# Patient Record
Sex: Female | Born: 1950 | Race: Black or African American | Hispanic: No | Marital: Married | Smoking: Never smoker
Health system: Southern US, Community
[De-identification: ages and names within clinical notes are randomized; demographics above are authoritative.]

## PROBLEM LIST (undated history)

## (undated) DIAGNOSIS — I10 Essential (primary) hypertension: Secondary | ICD-10-CM

## (undated) HISTORY — PX: APPENDECTOMY: SHX54

---

## 2009-09-01 ENCOUNTER — Encounter: Admission: RE | Admit: 2009-09-01 | Discharge: 2009-09-01 | Payer: Self-pay | Admitting: Otolaryngology

## 2012-02-29 ENCOUNTER — Inpatient Hospital Stay (HOSPITAL_COMMUNITY)
Admission: EM | Admit: 2012-02-29 | Discharge: 2012-03-03 | DRG: 294 | Disposition: A | Discharge status: Home / Self Care | Payer: BC Managed Care – PPO | Attending: Internal Medicine | Admitting: Internal Medicine

## 2012-02-29 ENCOUNTER — Encounter (HOSPITAL_COMMUNITY): Payer: Self-pay | Admitting: Emergency Medicine

## 2012-02-29 DIAGNOSIS — I4729 Other ventricular tachycardia: Secondary | ICD-10-CM | POA: Diagnosis not present

## 2012-02-29 DIAGNOSIS — R739 Hyperglycemia, unspecified: Secondary | ICD-10-CM

## 2012-02-29 DIAGNOSIS — E876 Hypokalemia: Secondary | ICD-10-CM | POA: Diagnosis present

## 2012-02-29 DIAGNOSIS — D72829 Elevated white blood cell count, unspecified: Secondary | ICD-10-CM | POA: Diagnosis present

## 2012-02-29 DIAGNOSIS — I10 Essential (primary) hypertension: Secondary | ICD-10-CM | POA: Diagnosis present

## 2012-02-29 DIAGNOSIS — E1100 Type 2 diabetes mellitus with hyperosmolarity without nonketotic hyperglycemic-hyperosmolar coma (NKHHC): Secondary | ICD-10-CM | POA: Diagnosis present

## 2012-02-29 DIAGNOSIS — I472 Ventricular tachycardia, unspecified: Secondary | ICD-10-CM | POA: Diagnosis not present

## 2012-02-29 DIAGNOSIS — E119 Type 2 diabetes mellitus without complications: Secondary | ICD-10-CM | POA: Diagnosis present

## 2012-02-29 DIAGNOSIS — E871 Hypo-osmolality and hyponatremia: Secondary | ICD-10-CM | POA: Diagnosis present

## 2012-02-29 DIAGNOSIS — I4949 Other premature depolarization: Secondary | ICD-10-CM | POA: Diagnosis not present

## 2012-02-29 HISTORY — DX: Essential (primary) hypertension: I10

## 2012-02-29 LAB — BASIC METABOLIC PANEL
CO2: 26 mEq/L (ref 19–32)
Calcium: 10 mg/dL (ref 8.4–10.5)
Creatinine, Ser: 0.92 mg/dL (ref 0.50–1.10)
GFR calc Af Amer: 76 mL/min — ABNORMAL LOW (ref >90)
GFR calc non Af Amer: 66 mL/min — ABNORMAL LOW (ref >90)
Glucose, Bld: 1072 mg/dL (ref 70–99)
Potassium: 4.4 mEq/L (ref 3.5–5.1)
Sodium: 124 mEq/L — ABNORMAL LOW (ref 135–145)

## 2012-02-29 LAB — CBC
HCT: 39.8 % (ref 36.0–46.0)
MCH: 27.6 pg (ref 26.0–34.0)
WBC: 15.8 10*3/uL — ABNORMAL HIGH (ref 4.0–10.5)

## 2012-02-29 LAB — DIFFERENTIAL
Basophils Absolute: 0 10*3/uL (ref 0.0–0.1)
Basophils Relative: 0 % (ref 0–1)
Eosinophils Absolute: 0.1 10*3/uL (ref 0.0–0.7)
Monocytes Relative: 7 % (ref 3–12)

## 2012-02-29 MED ORDER — SODIUM CHLORIDE 0.9 % IV BOLUS (SEPSIS)
1000.0000 mL | Freq: Once | INTRAVENOUS | Status: AC
  Administered 2012-03-01: 1000 mL via INTRAVENOUS

## 2012-02-29 NOTE — ED Notes (Signed)
PT. REPORTS URINARY FREQUENCY WITH DRY MOUTH AND FATIGUE FOR SEVERAL DAYS .

## 2012-02-29 NOTE — ED Notes (Signed)
Received call from lab ref. Glucose of 1072

## 2012-03-01 ENCOUNTER — Encounter (HOSPITAL_COMMUNITY): Payer: Self-pay | Admitting: Internal Medicine

## 2012-03-01 ENCOUNTER — Emergency Department (HOSPITAL_COMMUNITY): Payer: BC Managed Care – PPO

## 2012-03-01 DIAGNOSIS — D72829 Elevated white blood cell count, unspecified: Secondary | ICD-10-CM | POA: Diagnosis present

## 2012-03-01 DIAGNOSIS — E871 Hypo-osmolality and hyponatremia: Secondary | ICD-10-CM | POA: Diagnosis present

## 2012-03-01 DIAGNOSIS — I10 Essential (primary) hypertension: Secondary | ICD-10-CM | POA: Diagnosis present

## 2012-03-01 DIAGNOSIS — E1100 Type 2 diabetes mellitus with hyperosmolarity without nonketotic hyperglycemic-hyperosmolar coma (NKHHC): Secondary | ICD-10-CM | POA: Diagnosis present

## 2012-03-01 LAB — POCT I-STAT 3, VENOUS BLOOD GAS (G3P V)
Bicarbonate: 25.9 mEq/L — ABNORMAL HIGH (ref 20.0–24.0)
TCO2: 27 mmol/L (ref 0–100)

## 2012-03-01 LAB — GLUCOSE, CAPILLARY
Glucose-Capillary: >600 mg/dL (ref 70–99)
Glucose-Capillary: 172 mg/dL — ABNORMAL HIGH (ref 70–99)
Glucose-Capillary: 119 mg/dL — ABNORMAL HIGH (ref 70–99)
Glucose-Capillary: 100 mg/dL — ABNORMAL HIGH (ref 70–99)

## 2012-03-01 LAB — BASIC METABOLIC PANEL
CO2: 26 mEq/L (ref 19–32)
Calcium: 9.4 mg/dL (ref 8.4–10.5)
Chloride: 103 mEq/L (ref 96–112)
Chloride: 100 mEq/L (ref 96–112)
GFR calc non Af Amer: >90 mL/min (ref >90)
Glucose, Bld: 397 mg/dL — ABNORMAL HIGH (ref 70–99)
Potassium: 3.5 mEq/L (ref 3.5–5.1)
Potassium: 3.4 mEq/L — ABNORMAL LOW (ref 3.5–5.1)
Sodium: 140 mEq/L (ref 135–145)
Sodium: 135 mEq/L (ref 135–145)

## 2012-03-01 LAB — CBC
HCT: 36.1 % (ref 36.0–46.0)
Hemoglobin: 12.8 g/dL (ref 12.0–15.0)
MCHC: 35.5 g/dL (ref 30.0–36.0)
WBC: 14.3 10*3/uL — ABNORMAL HIGH (ref 4.0–10.5)

## 2012-03-01 LAB — URINE MICROSCOPIC-ADD ON

## 2012-03-01 LAB — HEMOGLOBIN A1C
Hgb A1c MFr Bld: 13 % — ABNORMAL HIGH (ref <5.7)
Mean Plasma Glucose: 326 mg/dL — ABNORMAL HIGH (ref <117)

## 2012-03-01 LAB — URINALYSIS, ROUTINE W REFLEX MICROSCOPIC
Bilirubin Urine: NEGATIVE
Ketones, ur: 15 mg/dL — AB
Protein, ur: NEGATIVE mg/dL
Specific Gravity, Urine: 1.031 — ABNORMAL HIGH (ref 1.005–1.030)

## 2012-03-01 MED ORDER — DEXTROSE 50 % IV SOLN: 25.0000 mL | INTRAVENOUS | Status: DC | PRN

## 2012-03-01 MED ORDER — ACETAMINOPHEN 325 MG PO TABS: 650.0000 mg | ORAL_TABLET | Freq: Four times a day (QID) | ORAL | Status: DC | PRN

## 2012-03-01 MED ORDER — INSULIN REGULAR BOLUS VIA INFUSION
0.0000 [IU] | Freq: Three times a day (TID) | INTRAVENOUS | Status: DC
  Filled 2012-03-01: qty 10

## 2012-03-01 MED ORDER — INSULIN ASPART 100 UNIT/ML ~~LOC~~ SOLN: 0.0000 [IU] | Freq: Three times a day (TID) | SUBCUTANEOUS | Status: DC

## 2012-03-01 MED ORDER — ONDANSETRON HCL 4 MG PO TABS: 4.0000 mg | ORAL_TABLET | Freq: Four times a day (QID) | ORAL | Status: DC | PRN

## 2012-03-01 MED ORDER — ONDANSETRON HCL 4 MG/2ML IJ SOLN: 4.0000 mg | Freq: Four times a day (QID) | INTRAMUSCULAR | Status: DC | PRN

## 2012-03-01 MED ORDER — SODIUM CHLORIDE 0.9 % IJ SOLN
3.0000 mL | Freq: Two times a day (BID) | INTRAMUSCULAR | Status: DC
  Administered 2012-03-01 – 2012-03-03 (×5): 3 mL via INTRAVENOUS

## 2012-03-01 MED ORDER — SODIUM CHLORIDE 0.9 % IV SOLN: INTRAVENOUS | Status: DC

## 2012-03-01 MED ORDER — SODIUM CHLORIDE 0.9 % IV SOLN
INTRAVENOUS | Status: DC
  Administered 2012-03-01: 5.4 [IU]/h via INTRAVENOUS
  Filled 2012-03-01: qty 1

## 2012-03-01 MED ORDER — POTASSIUM CHLORIDE CRYS ER 20 MEQ PO TBCR
40.0000 meq | EXTENDED_RELEASE_TABLET | Freq: Once | ORAL | Status: AC
  Administered 2012-03-01: 40 meq via ORAL
  Filled 2012-03-01: qty 2

## 2012-03-01 MED ORDER — ACETAMINOPHEN 650 MG RE SUPP: 650.0000 mg | Freq: Four times a day (QID) | RECTAL | Status: DC | PRN

## 2012-03-01 MED ORDER — LIVING WELL WITH DIABETES BOOK
Freq: Once | Status: AC
  Administered 2012-03-01: 05:00:00
  Filled 2012-03-01: qty 1

## 2012-03-01 MED ORDER — SENNA 8.6 MG PO TABS
1.0000 | ORAL_TABLET | Freq: Two times a day (BID) | ORAL | Status: DC
  Administered 2012-03-01 – 2012-03-03 (×4): 8.6 mg via ORAL
  Filled 2012-03-01 (×6): qty 1

## 2012-03-01 MED ORDER — INSULIN ASPART 100 UNIT/ML ~~LOC~~ SOLN
0.0000 [IU] | Freq: Three times a day (TID) | SUBCUTANEOUS | Status: DC
  Administered 2012-03-01: 15 [IU] via SUBCUTANEOUS

## 2012-03-01 MED ORDER — SODIUM CHLORIDE 0.9 % IV SOLN
INTRAVENOUS | Status: DC
  Administered 2012-03-01: 04:00:00 via INTRAVENOUS

## 2012-03-01 MED ORDER — DOCUSATE SODIUM 100 MG PO CAPS
100.0000 mg | ORAL_CAPSULE | Freq: Two times a day (BID) | ORAL | Status: DC
  Administered 2012-03-01 – 2012-03-03 (×5): 100 mg via ORAL
  Filled 2012-03-01 (×6): qty 1

## 2012-03-01 MED ORDER — INSULIN GLARGINE 100 UNIT/ML ~~LOC~~ SOLN
10.0000 [IU] | Freq: Once | SUBCUTANEOUS | Status: AC
  Administered 2012-03-01: 10 [IU] via SUBCUTANEOUS

## 2012-03-01 MED ORDER — SODIUM CHLORIDE 0.9 % IV BOLUS (SEPSIS)
1000.0000 mL | Freq: Once | INTRAVENOUS | Status: AC
  Administered 2012-03-01: 1000 mL via INTRAVENOUS

## 2012-03-01 MED ORDER — LABETALOL HCL 5 MG/ML IV SOLN
5.0000 mg | Freq: Four times a day (QID) | INTRAVENOUS | Status: DC | PRN
Start: 2012-03-01 — End: 2012-03-03
  Filled 2012-03-01: qty 4

## 2012-03-01 MED ORDER — INSULIN ASPART 100 UNIT/ML ~~LOC~~ SOLN
0.0000 [IU] | SUBCUTANEOUS | Status: DC
  Administered 2012-03-01: 8 [IU] via SUBCUTANEOUS
  Administered 2012-03-02: 2 [IU] via SUBCUTANEOUS
  Administered 2012-03-02: 15 [IU] via SUBCUTANEOUS
  Administered 2012-03-02: 8 [IU] via SUBCUTANEOUS
  Administered 2012-03-02: 2 [IU] via SUBCUTANEOUS
  Administered 2012-03-02: 11 [IU] via SUBCUTANEOUS
  Administered 2012-03-03: 3 [IU] via SUBCUTANEOUS
  Administered 2012-03-03: 11 [IU] via SUBCUTANEOUS

## 2012-03-01 MED ORDER — DEXTROSE-NACL 5-0.45 % IV SOLN
INTRAVENOUS | Status: DC
  Administered 2012-03-01: 07:00:00 via INTRAVENOUS

## 2012-03-01 NOTE — Progress Notes (Signed)
Subjective: Patient seen and examined . Denies any specific complaints  Objective:  Vital signs in last 24 hours:  Filed Vitals:   03/01/12 0235 03/01/12 0342 03/01/12 0655 03/01/12 1405  BP: 175/82 155/86 121/73 126/74  Pulse:  88 79 84  Temp: 98.3 F (36.8 C) 97.6 F (36.4 C) 97.7 F (36.5 C) 98.2 F (36.8 C)  TempSrc: Oral Oral Oral Oral  Resp: 18 16 16 17   Height:  5\' 2"  (1.575 m)    Weight:  88.27 kg (194 lb 9.6 oz)    SpO2: 98% 98% 98% 94%    Intake/Output from previous day:   Intake/Output Summary (Last 24 hours) at 03/01/12 1714 Last data filed at 03/01/12 1710  Gross per 24 hour  Intake 826.54 ml  Output   1050 ml  Net -223.46 ml    Physical Exam:  General: elderly female  in no acute distress. HEENT: no pallor, no icterus, moist oral mucosa, no JVD, no lymphadenopathy Heart: Normal  s1 &s2  Regular rate and rhythm, without murmurs, rubs, gallops. Lungs: Clear to auscultation bilaterally. Abdomen: Soft, nontender, nondistended, positive bowel sounds. Extremities: No clubbing cyanosis or edema with positive pedal pulses. Neuro: Alert, awake, oriented x3, nonfocal.   Lab Results:  Basic Metabolic Panel:    Component Value Date/Time   NA 135 03/01/2012 1511   K 3.4* 03/01/2012 1511   CL 100 03/01/2012 1511   CO2 24 03/01/2012 1511   BUN 10 03/01/2012 1511   CREATININE 0.60 03/01/2012 1511   GLUCOSE 397* 03/01/2012 1511   CALCIUM 8.8 03/01/2012 1511   CBC:    Component Value Date/Time   WBC 14.3* 03/01/2012 0557   HGB 12.8 03/01/2012 0557   HCT 36.1 03/01/2012 0557   PLT 205 03/01/2012 0557   MCV 75.7* 03/01/2012 0557   NEUTROABS 13.8* 02/29/2012 2249   LYMPHSABS 0.8 02/29/2012 2249   MONOABS 1.1* 02/29/2012 2249   EOSABS 0.1 02/29/2012 2249   BASOSABS 0.0 02/29/2012 2249    No results found for this or any previous visit (from the past 240 hour(s)).  Studies/Results: Dg Chest 2 View  03/01/2012  *RADIOLOGY REPORT*  Clinical Data: Increased urination,  first, and fatigue. Questionable diabetes.  CHEST - 2 VIEW  Comparison: None.  Findings: Normal heart size and pulmonary vascularity.  No focal airspace consolidation in the lungs.  No blunting of costophrenic angles.  No pneumothorax.  Tortuous aorta.  Degenerative changes in the spine.  Surgical clips in the right upper quadrant.  IMPRESSION: No evidence of active pulmonary disease.  Original Report Authenticated By: Marlon Pel, M.D.    Medications: Scheduled Meds:   . docusate sodium  100 mg Oral BID  . insulin aspart  0-15 Units Subcutaneous TID WC  . insulin glargine  10 Units Subcutaneous Once  . living well with diabetes book   Does not apply Once  . senna  1 tablet Oral BID  . sodium chloride  1,000 mL Intravenous Once  . sodium chloride  1,000 mL Intravenous Once  . sodium chloride  3 mL Intravenous Q12H  . DISCONTD: insulin regular  0-10 Units Intravenous TID WC   Continuous Infusions:   . sodium chloride    . sodium chloride 150 mL/hr at 03/01/12 0416  . dextrose 5 % and 0.45% NaCl 75 mL/hr at 03/01/12 0707  . insulin (NOVOLIN-R) infusion 1.2 Units/hr (03/01/12 1115)   PRN Meds:.acetaminophen, acetaminophen, dextrose, labetalol, ondansetron (ZOFRAN) IV, ondansetron  Assessment/Plan: 61 y/o AA female  with hx of HTN presented with 2 wks of polyuria , polydipsia with new onset DM with nonketotic hyperosmolar hyperglycemia     *Diabetes mellitus with nonketotic hyperosmolarity Admitted to tele with glucose stabilizer  Protocol AG closed and weaned off drip. Given 10 units lantus  will start sliding scale coverage Follow A1C , lipid panel Replenish low k  cont IV fluids  diabetic coordinator consult Will likely need sq insulin on discharge    Hypertension Cont home meds    Leukocytosis Possibly stress induced     LOS: 1 day   Jernie Schutt 03/01/2012, 5:14 PM

## 2012-03-01 NOTE — ED Notes (Signed)
Report called to [facility] on floor and patient ready for move.

## 2012-03-01 NOTE — ED Notes (Signed)
Dr. Kaylyn Layer at bedside to admit patient.

## 2012-03-01 NOTE — Progress Notes (Signed)
Admitted pt to rm [id number] from ED via stretcher. Pt alert and oriented, denied pain at this time. Oriented to room, call bell placed within reach. Pt on glucostabilizer started at ED, pt ST on heart monitor (HR=113) on admit to floor. Admission assessment done, orders carried out. Will continue to monitor.  Filed Vitals:   03/01/12 0342  BP: 155/86  Pulse: 88  Temp: 97.6 F (36.4 C)  Resp: 16   Kayla Snyder, [address]

## 2012-03-01 NOTE — Plan of Care (Signed)
Problem: Consults Goal: Diagnosis-Diabetes Mellitus Outcome: Progressing Pt newly diagnosed of Diabetes mellitus, currently on glucostabilizer.

## 2012-03-01 NOTE — ED Provider Notes (Signed)
History     CSN: [id number]  Arrival date & time 02/29/12  2229   First MD Initiated Contact with Patient 02/29/12 2355      Chief Complaint  Patient presents with  . Urinary Frequency    (Consider location/radiation/quality/duration/timing/severity/associated sxs/prior treatment) Patient is a 61 y.o. female presenting with weakness. The history is provided by the patient and a relative.  Weakness The primary symptoms include headaches and dizziness. Primary symptoms do not include altered mental status, focal weakness, fever, nausea or vomiting. The symptoms began more than 1 week ago. The symptoms are worsening.  The headache is associated with weakness.  Dizziness also occurs with weakness. Dizziness does not occur with nausea or vomiting.  Additional symptoms include weakness.  Pt states over last two weeks she has been feeling tired, has had urinary frequency, and drinking lots of fluids. States today, her family talked her into coming in. Pt denies fever, URI symptoms, GI symptoms. No other complaints.   Past Medical History  Diagnosis Date  . Hypertension     History reviewed. No pertinent past surgical history.  No family history on file.  History  Substance Use Topics  . Smoking status: Never Smoker   . Smokeless tobacco: Not on file  . Alcohol Use: No    OB History    Grav Para Term Preterm Abortions TAB SAB Ect Mult Living                  Review of Systems  Constitutional: Negative for fever and chills.  Eyes: Negative.   Cardiovascular: Negative.   Gastrointestinal: Negative for nausea, vomiting and abdominal pain.  Genitourinary: Positive for frequency. Negative for dysuria, flank pain and difficulty urinating.  Skin: Negative.   Neurological: Positive for dizziness, weakness and headaches. Negative for focal weakness.  Psychiatric/Behavioral: Negative for altered mental status.    Allergies  Review of patient's allergies indicates no known  allergies.  Home Medications  No current outpatient prescriptions on file.  BP 180/84  Pulse 84  Temp(Src) 98.1 F (36.7 C) (Oral)  Resp 14  SpO2 96%  Physical Exam  Nursing note and vitals reviewed. Constitutional: She is oriented to person, place, and time. She appears well-developed and well-nourished.  HENT:  Head: Normocephalic and atraumatic.  Eyes: Conjunctivae are normal.  Neck: Neck supple.  Cardiovascular: Normal rate, regular rhythm and normal heart sounds.   Pulmonary/Chest: Effort normal and breath sounds normal.  Abdominal: Soft. Bowel sounds are normal. She exhibits no distension. There is no tenderness.  Musculoskeletal: Normal range of motion. She exhibits no edema and no tenderness.  Lymphadenopathy:    She has no cervical adenopathy.  Neurological: She is alert and oriented to person, place, and time. She has normal reflexes. No cranial nerve deficit. She exhibits normal muscle tone. Coordination normal.  Skin: Skin is warm and dry.  Psychiatric: She has a normal mood and affect.    ED Course  Procedures (including critical care time)  Labs Reviewed  URINALYSIS, ROUTINE W REFLEX MICROSCOPIC - Abnormal; Notable for the following:    Color, Urine STRAW (*)    Specific Gravity, Urine 1.031 (*)    Glucose, UA >1000 (*)    Ketones, ur 15 (*)    Leukocytes, UA SMALL (*)    All other components within normal limits  CBC - Abnormal; Notable for the following:    WBC 15.8 (*)    RBC 5.14 (*)    MCV 77.4 (*)  RDW 16.7 (*)    All other components within normal limits  DIFFERENTIAL - Abnormal; Notable for the following:    Neutrophils Relative 87 (*)    Neutro Abs 13.8 (*)    Lymphocytes Relative 5 (*)    Monocytes Absolute 1.1 (*)    All other components within normal limits  BASIC METABOLIC PANEL - Abnormal; Notable for the following:    Sodium 124 (*)    Chloride 84 (*)    Glucose, Bld 1072 (*)    GFR calc non Af Amer 66 (*)    GFR calc Af Amer 76  (*)    All other components within normal limits  POCT I-STAT 3, BLOOD GAS (G3P V) - Abnormal; Notable for the following:    pH, Ven 7.365 (*)    Bicarbonate 25.9 (*)    All other components within normal limits  URINE MICROSCOPIC-ADD ON - Abnormal; Notable for the following:    Squamous Epithelial / LPF FEW (*)    Bacteria, UA FEW (*)    All other components within normal limits  GLUCOSE, CAPILLARY - Abnormal; Notable for the following:    Glucose-Capillary >600 (*)    All other components within normal limits  BLOOD GAS, VENOUS  URINE CULTURE   Dg Chest 2 View  03/01/2012  *RADIOLOGY REPORT*  Clinical Data: Increased urination, first, and fatigue. Questionable diabetes.  CHEST - 2 VIEW  Comparison: None.  Findings: Normal heart size and pulmonary vascularity.  No focal airspace consolidation in the lungs.  No blunting of costophrenic angles.  No pneumothorax.  Tortuous aorta.  Degenerative changes in the spine.  Surgical clips in the right upper quadrant.  IMPRESSION: No evidence of active pulmonary disease.  Original Report Authenticated By: Marlon Pel, M.D.   Pt in NAD. Hypertensive, ran out of blood pressure medications about a month ago. Blood sugar 1072, anion gap 14, ph 7.365, fluids started, glucose stabilizer started. Will admit.   Spoke with triad, will admit pt for further evaluation and treatment.   No diagnosis found.    MDM          Lottie Mussel, PA 03/01/12 [phone]

## 2012-03-01 NOTE — H&P (Signed)
PCP:  No primary provider on file.   Chief Complaint:  Polyuria, polydipsia, fatigue  HPI: [name] with h/o HTN presents with severe hyperglycemia, hyperosmolar non-ketotic -- new diagnosis  of diabetes.   Pt has no history of diabetes. She states that 2 weeks ago she developed increased thirst,  increased urination, and fatigue. She had one episode of hot flushing that self-resolved at work a  couple weeks ago, but no true fevers, chills, sweats. No cardiopulmonary symptoms of CP, SOB. No  GI issues, no n/v/d/abd pain.   In the ED, vitals were stable. Labs consistent with hypoosmolar nonketotic hyperglycemia: hypoNa  124, hypoCl 84, glucose 1072. renal 21/0.92. WBC was 15.8. VBG 7.365 / CO2 45 / O2 44 / HCO3 26.  UA with glucose, minimal ketones, no infection. CXR negative.   ROS otherwise negative. Pt is active, still working, other than HTN denies any other major medical  problems.    Past Medical History  Diagnosis Date  . Hypertension   . Diabetes mellitus     History reviewed. No pertinent past surgical history.  Medications:  HOME MEDS: States she takes BP meds but cannot name them, and potassium supplement Prior to Admission medications   Not on File    Allergies:  No Known Allergies  Social History:   reports that she has never smoked. She does not have any smokeless tobacco history on file. She reports that she does not drink alcohol or use illicit drugs.  Family History: No family history on file.  Physical Exam: Filed Vitals:   02/29/12 2250 03/01/12 0235  BP: 180/84 175/82  Pulse: 84   Temp: 98.1 F (36.7 C) 98.3 F (36.8 C)  TempSrc: Oral Oral  Resp: 14 18  SpO2: 96% 98%   Blood pressure 175/82, pulse 84, temperature 98.3 F (36.8 C), temperature source Oral, resp. rate 18, SpO2 98.00%. Gen: Middle aged F in no distress, pleasant, able to relate history well, overall well appearing HEENT: Pupils round and reactive, some scleral muddying  noted, mouth moist and normal appearing,  not really that dry  Lungs: CTAB no c/w/r, good air movement, normal exam Heart: Regular, not tachycardic, no m/g, overall normal exam Abd: Soft, non tender, non distended, no facial grimacing, obese Extrem: Warm, perfusing well, radials palpable, no BLE edema noted Neuro: Alert, attentive, conversant, CN 2-12 intact, moves extremities well, sits up in bed on her  own. Grossly nonfocal    Labs & Imaging Results for orders placed during the hospital encounter of 02/29/12 (from the past 48 hour(s))  CBC     Status: Abnormal   Collection Time   02/29/12 10:49 PM      Component Value Range Comment   WBC 15.8 (*) 4.0 - 10.5 (K/uL)    RBC 5.14 (*) 3.87 - 5.11 (MIL/uL)    Hemoglobin 14.2  12.0 - 15.0 (g/dL)    HCT 16.1  09.6 - 04.5 (%)    MCV 77.4 (*) 78.0 - 100.0 (fL)    MCH 27.6  26.0 - 34.0 (pg)    MCHC 35.4  30.0 - 36.0 (g/dL)    RDW 40.9 (*) 81.1 - 15.5 (%)    Platelets 230  150 - 400 (K/uL)   DIFFERENTIAL     Status: Abnormal   Collection Time   02/29/12 10:49 PM      Component Value Range Comment   Neutrophils Relative 87 (*) 43 - 77 (%)    Neutro Abs 13.8 (*) 1.7 - 7.7 (  K/uL)    Lymphocytes Relative 5 (*) 12 - 46 (%)    Lymphs Abs 0.8  0.7 - 4.0 (K/uL)    Monocytes Relative 7  3 - 12 (%)    Monocytes Absolute 1.1 (*) 0.1 - 1.0 (K/uL)    Eosinophils Relative 0  0 - 5 (%)    Eosinophils Absolute 0.1  0.0 - 0.7 (K/uL)    Basophils Relative 0  0 - 1 (%)    Basophils Absolute 0.0  0.0 - 0.1 (K/uL)   BASIC METABOLIC PANEL     Status: Abnormal   Collection Time   02/29/12 10:49 PM      Component Value Range Comment   Sodium 124 (*) 135 - 145 (mEq/L)    Potassium 4.4  3.5 - 5.1 (mEq/L)    Chloride 84 (*) 96 - 112 (mEq/L)    CO2 26  19 - 32 (mEq/L)    Glucose, Bld 1072 (*) 70 - 99 (mg/dL)    BUN 21  6 - 23 (mg/dL)    Creatinine, Ser 1.61  0.50 - 1.10 (mg/dL)    Calcium 09.6  8.4 - 10.5 (mg/dL)    GFR calc non Af Amer 66 (*) >90 (mL/min)     GFR calc Af Amer 76 (*) >90 (mL/min)   POCT I-STAT 3, BLOOD GAS (G3P V)     Status: Abnormal   Collection Time   03/01/12 12:10 AM      Component Value Range Comment   pH, Ven 7.365 (*) 7.250 - 7.300     pCO2, Ven 45.2  45.0 - 50.0 (mmHg)    pO2, Ven 44.0  30.0 - 45.0 (mmHg)    Bicarbonate 25.9 (*) 20.0 - 24.0 (mEq/L)    TCO2 27  0 - 100 (mmol/L)    O2 Saturation 77.0      Sample type VENOUS     URINALYSIS, ROUTINE W REFLEX MICROSCOPIC     Status: Abnormal   Collection Time   03/01/12 12:18 AM      Component Value Range Comment   Color, Urine STRAW (*) YELLOW     APPearance CLEAR  CLEAR     Specific Gravity, Urine 1.031 (*) 1.005 - 1.030     pH 5.5  5.0 - 8.0     Glucose, UA >1000 (*) NEGATIVE (mg/dL)    Hgb urine dipstick NEGATIVE  NEGATIVE     Bilirubin Urine NEGATIVE  NEGATIVE     Ketones, ur 15 (*) NEGATIVE (mg/dL)    Protein, ur NEGATIVE  NEGATIVE (mg/dL)    Urobilinogen, UA 0.2  0.0 - 1.0 (mg/dL)    Nitrite NEGATIVE  NEGATIVE     Leukocytes, UA SMALL (*) NEGATIVE    URINE MICROSCOPIC-ADD ON     Status: Abnormal   Collection Time   03/01/12 12:18 AM      Component Value Range Comment   Squamous Epithelial / LPF FEW (*) RARE     WBC, UA 11-20  <3 (WBC/hpf)    RBC / HPF 0-2  <3 (RBC/hpf)    Bacteria, UA FEW (*) RARE     Urine-Other FEW YEAST     GLUCOSE, CAPILLARY     Status: Abnormal   Collection Time   03/01/12  1:18 AM      Component Value Range Comment   Glucose-Capillary >600 (*) 70 - 99 (mg/dL)    Comment 1 Notify RN      Comment 2 Documented in Chart  Dg Chest 2 View  03/01/2012  *RADIOLOGY REPORT*  Clinical Data: Increased urination, first, and fatigue. Questionable diabetes.  CHEST - 2 VIEW  Comparison: None.  Findings: Normal heart size and pulmonary vascularity.  No focal airspace consolidation in the lungs.  No blunting of costophrenic angles.  No pneumothorax.  Tortuous aorta.  Degenerative changes in the spine.  Surgical clips in the right upper  quadrant.  IMPRESSION: No evidence of active pulmonary disease.  Original Report Authenticated By: Marlon Pel, M.D.    Impression Present on Admission:  .Diabetes mellitus .Hypertension .Diabetes mellitus with nonketotic hyperosmolarity .Hyponatremia .Leukocytosis  [name] with h/o HTN presents with severe hyperglycemia, hyperosmolar non-ketotic -- new diagnosis  of diabetes.   1. Hyperosmolar non ketotic: New diagnosis of diabetes.  - IVF's and IV insulin. DM, nutrition, SW consultations. Likely needs to be discharged on subQ  insulin - Clear liquid diet, can advance as tolerated when sugars well controlled   2. Leukocytosis: UA and CXR negative, likely stress reaction to HONK, just monitor for now.   3. Needs medication reconciliation  Ambulatory DVT prophy Telemetry bed, MC team 2 Presumed full code   Other plans as per orders.  Alice Burnside 03/01/2012, 2:41 AM

## 2012-03-02 LAB — BASIC METABOLIC PANEL
BUN: 7 mg/dL (ref 6–23)
CO2: 28 mEq/L (ref 19–32)
Chloride: 102 mEq/L (ref 96–112)
GFR calc Af Amer: >90 mL/min (ref >90)
GFR calc Af Amer: >90 mL/min (ref >90)
GFR calc non Af Amer: >90 mL/min (ref >90)
Glucose, Bld: 271 mg/dL — ABNORMAL HIGH (ref 70–99)
Potassium: 3.5 mEq/L (ref 3.5–5.1)
Potassium: 3.9 mEq/L (ref 3.5–5.1)
Sodium: 134 mEq/L — ABNORMAL LOW (ref 135–145)

## 2012-03-02 LAB — URINE CULTURE: Culture  Setup Time: 201303311135

## 2012-03-02 LAB — GLUCOSE, CAPILLARY
Glucose-Capillary: 135 mg/dL — ABNORMAL HIGH (ref 70–99)
Glucose-Capillary: 348 mg/dL — ABNORMAL HIGH (ref 70–99)
Glucose-Capillary: 281 mg/dL — ABNORMAL HIGH (ref 70–99)
Glucose-Capillary: 322 mg/dL — ABNORMAL HIGH (ref 70–99)

## 2012-03-02 LAB — MAGNESIUM: Magnesium: 1.5 mg/dL (ref 1.5–2.5)

## 2012-03-02 MED ORDER — MAGNESIUM SULFATE 40 MG/ML IJ SOLN
2.0000 g | Freq: Once | INTRAMUSCULAR | Status: AC
  Administered 2012-03-02: 2 g via INTRAVENOUS
  Filled 2012-03-02: qty 50

## 2012-03-02 MED ORDER — POTASSIUM CHLORIDE CRYS ER 20 MEQ PO TBCR
40.0000 meq | EXTENDED_RELEASE_TABLET | Freq: Once | ORAL | Status: AC
  Administered 2012-03-02: 40 meq via ORAL
  Filled 2012-03-02: qty 2

## 2012-03-02 MED ORDER — METFORMIN HCL 500 MG PO TABS
500.0000 mg | ORAL_TABLET | Freq: Two times a day (BID) | ORAL | Status: DC
  Administered 2012-03-03: 500 mg via ORAL
  Filled 2012-03-02 (×3): qty 1

## 2012-03-02 MED ORDER — BD GETTING STARTED TAKE HOME KIT: 1/2ML X 30G SYRINGES
1.0000 | Freq: Once | Status: DC
  Filled 2012-03-02: qty 1

## 2012-03-02 MED ORDER — INSULIN GLARGINE 100 UNIT/ML ~~LOC~~ SOLN
18.0000 [IU] | Freq: Every day | SUBCUTANEOUS | Status: DC
  Administered 2012-03-02: 18 [IU] via SUBCUTANEOUS

## 2012-03-02 NOTE — ED Provider Notes (Signed)
Medical screening examination/treatment/procedure(s) were performed by non-physician practitioner and as supervising physician I was immediately available for consultation/collaboration.   Frederico Gerling M Rebbeca Sheperd, DO 03/02/12 0644 

## 2012-03-02 NOTE — Progress Notes (Signed)
Inpatient Diabetes Program Recommendations  AACE/ADA: New Consensus Statement on Inpatient Glycemic Control (2009)  Target Ranges:  Prepandial:   less than 140 mg/dL      Peak postprandial:   less than 180 mg/dL (1-2 hours)      Critically ill patients:  140 - 180 mg/dL     Inpatient Diabetes Program Recommendations Insulin - IV drip/GlucoStabilizer: . Insulin - Basal: Start LANTUS 18 units daily Outpatient Referral: order [facility] education at [facility]  Thank you  Piedad Climes [facility] Inpatient Diabetes Coordinator [phone]

## 2012-03-02 NOTE — Progress Notes (Signed)
Pt had a 7 beat run of Vtach.  Pt asymptomatic at the time, denying any chest pain, lightheadness, or dizziness.  MD has been made aware.  New orders given.  Will continue to monitor. Nino Glow RN

## 2012-03-02 NOTE — Progress Notes (Signed)
Utilization Review Completed.Heloise Gordan T4/12/2011   

## 2012-03-02 NOTE — Progress Notes (Signed)
Inpatient Diabetes Program Recommendations  AACE/ADA: New Consensus Statement on Inpatient Glycemic Control (2009)  Target Ranges:  Prepandial:   less than 140 mg/dL      Peak postprandial:   less than 180 mg/dL (1-2 hours)      Critically ill patients:  140 - 180 mg/dL   Post-prandial elevations may require Novolog meal coverage.    Inpatient Diabetes Program Recommendations Insulin - IV drip/GlucoStabilizer: . Insulin - Basal: Start LANTUS 18 units daily Insulin - Meal Coverage: May also benefit from addition of Novolog meal coverage 4 units Outpatient Referral: order OP education at [facility] Also consider starting Metformin 500 mg  Spoke with patient concerning new diagnosis of DM.  Patient said that she has already watched the DM videos and that the RN's have been teaching her how to admin insulin with a pen.  Patient seems well informed of her new diagnosis and ready to make lifestyle changes.  Recommend patient go to the OP [facility] ([facility]) for further education.  Please order at discharge.  Thank you  Piedad Climes [facility] Inpatient Diabetes Coordinator [phone]

## 2012-03-02 NOTE — Plan of Care (Signed)
Problem: Food- and Nutrition-Related Knowledge Deficit (NB-1.1) Goal: Nutrition education Formal process to instruct or train a patient/client in a skill or to impart knowledge to help patients/clients voluntarily manage or modify food choices and eating behavior to maintain or improve health.  Outcome: Completed/Met Date Met:  03/02/12 RD consulted for new onset DM education. Pt very willing to make changes in diet for BS control. RD explained foods containing carbs, meal planning, and counting servings. Pt asked many questions and was very engaged with education. Pt denied additional needs. No further questions. Encouraged to ask RN if further questions can be answered. Chart reviewed, Diet Carb Mod Medium, PO intake 75% last meal. Body mass index is 36.38 kg/(m^2). obesity.  Lab Results  Component Value Date    HGBA1C 13.0* 03/01/2012   No additional nutrition interventions.  Clarene Duke MARIE [phone]

## 2012-03-02 NOTE — Progress Notes (Signed)
   CARE MANAGEMENT NOTE 03/02/2012  Patient:  Kayla Snyder, Kayla Snyder   Account Number:  [id number]  Date Initiated:  03/02/2012  Documentation initiated by:  Donn Pierini  Subjective/Objective Assessment:   Pt admitted with DM- new onset     Action/Plan:   PTA pt lived at home with family, independent with ADLs   Anticipated DC Date:  03/03/2012   Anticipated DC Plan:  HOME/SELF CARE      DC Planning Services  CM consult      Choice offered to / List presented to:             Status of service:  In process, will continue to follow Medicare Important Message given?   (If response is "NO", the following Medicare IM given date fields will be blank) Date Medicare IM given:   Date Additional Medicare IM given:    Discharge Disposition:    Per UR Regulation:    If discussed at Long Length of Stay Meetings, dates discussed:    Comments:  PCP- [place]  03/02/12- 1645- Donn Pierini RN, BSN [phone] Spoke with pt at bedside- per conversation pt states that she has an appointment with her PCP tomorrow at 3:30. She gets her medications at [facility] and has medication benefits. Pt has transportation home. CM to follow

## 2012-03-02 NOTE — Progress Notes (Signed)
Subjective: Patient seen and examined this morning.f eels better overall. Multiple PVCs noted on tele and during the afternoon had a short run of v tach. Asymptomatic otherwise  Objective:  Vital signs in last 24 hours:  Filed Vitals:   03/01/12 0655 03/01/12 1405 03/02/12 0458 03/02/12 1420  BP: 121/73 126/74 142/78 150/96  Pulse: 79 84 69 68  Temp: 97.7 F (36.5 C) 98.2 F (36.8 C) 97 F (36.1 C) 98.1 F (36.7 C)  TempSrc: Oral Oral Oral Oral  Resp: 16 17 18 18   Height:      Weight:   90.22 kg (198 lb 14.4 oz)   SpO2: 98% 94% 97% 95%    Intake/Output from previous day:   Intake/Output Summary (Last 24 hours) at 03/02/12 1627 Last data filed at 03/02/12 1453  Gross per 24 hour  Intake    966 ml  Output    750 ml  Net    216 ml    Physical Exam:  General: elderly female in no acute distress.  HEENT: no pallor, no icterus, moist oral mucosa, no JVD, no lymphadenopathy  Heart: Normal s1 &s2 Regular rate and rhythm, without murmurs, rubs, gallops.  Lungs: Clear to auscultation bilaterally.  Abdomen: Soft, nontender, nondistended, positive bowel sounds.  Extremities: No clubbing cyanosis or edema with positive pedal pulses.  Neuro: Alert, awake, oriented x3, nonfocal.    Lab Results:  Basic Metabolic Panel:    Component Value Date/Time   NA 137 03/01/2012 2300   K 3.5 03/01/2012 2300   CL 102 03/01/2012 2300   CO2 28 03/01/2012 2300   BUN 7 03/01/2012 2300   CREATININE 0.60 03/01/2012 2300   GLUCOSE 182* 03/01/2012 2300   CALCIUM 9.0 03/01/2012 2300   CBC:    Component Value Date/Time   WBC 14.3* 03/01/2012 0557   HGB 12.8 03/01/2012 0557   HCT 36.1 03/01/2012 0557   PLT 205 03/01/2012 0557   MCV 75.7* 03/01/2012 0557   NEUTROABS 13.8* 02/29/2012 2249   LYMPHSABS 0.8 02/29/2012 2249   MONOABS 1.1* 02/29/2012 2249   EOSABS 0.1 02/29/2012 2249   BASOSABS 0.0 02/29/2012 2249    Recent Results (from the past 240 hour(s))  URINE CULTURE     Status: Normal   Collection Time   03/01/12 12:18 AM      Component Value Range Status Comment   Specimen Description URINE, RANDOM   Final    Special Requests CX ADDED 0205   Final    Culture  Setup Time [id number]   Final    Colony Count 20,OOO COLONIES/ML   Final    Culture     Final    Value: Multiple bacterial morphotypes present, none predominant. Suggest appropriate recollection if clinically indicated.   Report Status 03/02/2012 FINAL   Final     Studies/Results: Dg Chest 2 View  03/01/2012  *RADIOLOGY REPORT*  Clinical Data: Increased urination, first, and fatigue. Questionable diabetes.  CHEST - 2 VIEW  Comparison: None.  Findings: Normal heart size and pulmonary vascularity.  No focal airspace consolidation in the lungs.  No blunting of costophrenic angles.  No pneumothorax.  Tortuous aorta.  Degenerative changes in the spine.  Surgical clips in the right upper quadrant.  IMPRESSION: No evidence of active pulmonary disease.  Original Report Authenticated By: Marlon Pel, M.D.    Medications: Scheduled Meds:   . bd getting started take home kit  1 kit Other Once  . docusate sodium  100 mg Oral BID  .  insulin aspart  0-15 Units Subcutaneous Q4H  . potassium chloride  40 mEq Oral Once  . senna  1 tablet Oral BID  . sodium chloride  3 mL Intravenous Q12H  . DISCONTD: insulin aspart  0-15 Units Subcutaneous TID WC  . DISCONTD: insulin aspart  0-15 Units Subcutaneous TID WC   Continuous Infusions:   . DISCONTD: sodium chloride    . DISCONTD: sodium chloride 150 mL/hr at 03/01/12 0416  . DISCONTD: dextrose 5 % and 0.45% NaCl 75 mL/hr at 03/01/12 0707  . DISCONTD: insulin (NOVOLIN-R) infusion 1.2 Units/hr (03/01/12 1115)   PRN Meds:.acetaminophen, acetaminophen, dextrose, labetalol, ondansetron (ZOFRAN) IV, ondansetron   Assessment/Plan:  61 y/o AA female with hx of HTN presented with 2 wks of polyuria , polydipsia with new onset DM with nonketotic hyperosmolar hyperglycemia    *Diabetes mellitus with nonketotic hyperosmolarity  Admitted to tele with glucose stabilizer Protocol  AG closed and weaned off drip. Given 10 units lantus  will started sliding scale coverage and fsg improved A1C of 13 cont IV fluids  diabetic coordinator counseled, recommend 18 unitas lantus daily  Will add metformin  500 mg bid as well    Hypertension  Cont home meds   Leukocytosis  Possibly stress induced   Patient had a short run of v tach this afternoon. Plan for d/c held , will check k and mg and replenish as needed. Cont tele monitoring overnight and if stable d/c home . Has ppt with PCP tomorrow at 3:30 pm     LOS: 2 days   Helvi Royals 03/02/2012, 4:27 PM

## 2012-03-03 DIAGNOSIS — E119 Type 2 diabetes mellitus without complications: Secondary | ICD-10-CM | POA: Diagnosis present

## 2012-03-03 DIAGNOSIS — E876 Hypokalemia: Secondary | ICD-10-CM | POA: Diagnosis present

## 2012-03-03 LAB — GLUCOSE, CAPILLARY
Glucose-Capillary: 174 mg/dL — ABNORMAL HIGH (ref 70–99)
Glucose-Capillary: 105 mg/dL — ABNORMAL HIGH (ref 70–99)
Glucose-Capillary: 238 mg/dL — ABNORMAL HIGH (ref 70–99)

## 2012-03-03 MED ORDER — FREESTYLE SYSTEM KIT: 1.0000 | PACK | Status: AC | PRN

## 2012-03-03 MED ORDER — ACCU-CHEK SOFT TOUCH LANCETS MISC: Status: AC

## 2012-03-03 MED ORDER — INSULIN GLARGINE 100 UNIT/ML ~~LOC~~ SOLN: 18.0000 [IU] | Freq: Every day | SUBCUTANEOUS | Status: DC

## 2012-03-03 MED ORDER — LOSARTAN POTASSIUM-HCTZ 50-12.5 MG PO TABS: 1.0000 | ORAL_TABLET | Freq: Every day | ORAL | Status: DC

## 2012-03-03 MED ORDER — METFORMIN HCL 500 MG PO TABS: 500.0000 mg | ORAL_TABLET | Freq: Two times a day (BID) | ORAL | Status: DC

## 2012-03-03 MED ORDER — SYRINGE (DISPOSABLE) 1 ML MISC: 30.0000 "application " | Status: DC

## 2012-03-03 MED ORDER — AMLODIPINE BESYLATE 5 MG PO TABS: 5.0000 mg | ORAL_TABLET | Freq: Every day | ORAL | Status: DC

## 2012-03-03 NOTE — Progress Notes (Signed)
   CARE MANAGEMENT NOTE 03/03/2012  Patient:  Kayla Snyder, Kayla Snyder   Account Number:  [id number]  Date Initiated:  03/02/2012  Documentation initiated by:  Donn Pierini  Subjective/Objective Assessment:   Pt admitted with DM- new onset     Action/Plan:   PTA pt lived at home with family, independent with ADLs   Anticipated DC Date:  03/03/2012   Anticipated DC Plan:  HOME/SELF CARE      DC Planning Services  CM consult      Choice offered to / List presented to:             Status of service:  Completed, signed off Medicare Important Message given?   (If response is "NO", the following Medicare IM given date fields will be blank) Date Medicare IM given:   Date Additional Medicare IM given:    Discharge Disposition:  HOME/SELF CARE  Per UR Regulation:    If discussed at Long Length of Stay Meetings, dates discussed:    Comments:  PCP- [place]  03/03/12 Onnie Boer, RN, BSN [phone] PT DC'D TO HOME WITH SELF CARE  03/02/12- 1645- Donn Pierini RN, BSN [phone] Spoke with pt at bedside- per conversation pt states that she has an appointment with her PCP tomorrow at 3:30. She gets her medications at [facility] and has medication benefits. Pt has transportation home. CM to follow

## 2012-03-03 NOTE — Progress Notes (Signed)
IV d/c'd.  Tele d/c'd.  Pt d/c'd to home.  Home meds and d/c instructions have been discussed and reviewed with pt.  Pt denies any questions or concerns at this time.  Pt leaving unit via wheelchair and appears in no acute distress.   Rodolphe Edmonston RN 

## 2012-03-03 NOTE — Progress Notes (Signed)
Clinical Social Worker received referral for new Diabetes diagnosis and necessity for home medication assistance.  At this time, this is an inappropriate social work referral and case management is aware of patient needs prior to discharge.    Clinical Social Worker will sign off for now as social work intervention is no longer needed. Please consult [place] again if new need arises.  [address] [place], [place] [phone]

## 2012-03-03 NOTE — Discharge Instructions (Addendum)
Blood Sugar Monitoring, Adult GLUCOSE METERS FOR SELF-MONITORING OF BLOOD GLUCOSE  It is important to be able to correctly measure your blood sugar (glucose). You can use a blood glucose monitor (a small battery-operated device) to check your glucose level at any time. This allows you and your caregiver to monitor your diabetes and to determine how well your treatment plan is working. The process of monitoring your blood glucose with a glucose meter is called self-monitoring of blood glucose (SMBG). When people with diabetes control their blood sugar, they have better health. To test for glucose with a typical glucose meter, place the disposable strip in the meter. Then place a small sample of blood on the "test strip." The test strip is coated with chemicals that combine with glucose in blood. The meter measures how much glucose is present. The meter displays the glucose level as a number. Several new models can record and store a number of test results. Some models can connect to personal computers to store test results or print them out.  Newer meters are often easier to use than older models. Some meters allow you to get blood from places other than your fingertip. Some new models have automatic timing, error codes, signals, or barcode readers to help with proper adjustment (calibration). Some meters have a large display screen or spoken instructions for people with visual impairments.  INSTRUCTIONS FOR USING GLUCOSE METERS  Wash your hands with soap and warm water, or clean the area with alcohol. Dry your hands completely.   Prick the side of your fingertip with a lancet (a sharp-pointed tool used by hand).   Hold the hand down and gently milk the finger until a small drop of blood appears. Catch the blood with the test strip.   Follow the instructions for inserting the test strip and using the SMBG meter. Most meters require the meter to be turned on and the test strip to be inserted before  applying the blood sample.   Record the test result.   Read the instructions carefully for both the meter and the test strips that go with it. Meter instructions are found in the user manual. Keep this manual to help you solve any problems that may arise. Many meters use "error codes" when there is a problem with the meter, the test strip, or the blood sample on the strip. You will need the manual to understand these error codes and fix the problem.   New devices are available such as laser lancets and meters that can test blood taken from "alternative sites" of the body, other than fingertips. However, you should use standard fingertip testing if your glucose changes rapidly. Also, use standard testing if:   You have eaten, exercised, or taken insulin in the past 2 hours.   You think your glucose is low.   You tend to not feel symptoms of low blood glucose (hypoglycemia).   You are ill or under stress.   Clean the meter as directed by the manufacturer.   Test the meter for accuracy as directed by the manufacturer.   Take your meter with you to your caregiver's office. This way, you can test your glucose in front of your caregiver to make sure you are using the meter correctly. Your caregiver can also take a sample of blood to test using a routine lab method. If values on the glucose meter are close to the lab results, you and your caregiver will see that your meter is working well  and you are using good technique. Your caregiver will advise you about what to do if the results do not match.  FREQUENCY OF TESTING  Your caregiver will tell you how often you should check your blood glucose. This will depend on your type of diabetes, your current level of diabetes control, and your types of medicines. The following are general guidelines, but your care plan may be different. Record all your readings and the time of day you took them for review with your caregiver.   Diabetes type 1.   When you  are using insulin with good diabetic control (either multiple daily injections or via a pump), you should check your glucose 4 times a day.   If your diabetes is not well controlled, you may need to monitor more frequently, including before meals and 2 hours after meals, at bedtime, and occasionally between 2 a.m. and 3 a.m.   You should always check your glucose before a dose of insulin or before changing the rate on your insulin pump.   Diabetes type 2.   Guidelines for SMBG in diabetes type 2 are not as well defined.   If you are on insulin, follow the guidelines above.   If you are on medicines, but not insulin, and your glucose is not well controlled, you should test at least twice daily.   If you are not on insulin, and your diabetes is controlled with medicines or diet alone, you should test at least once daily, usually before breakfast.   A weekly profile will help your caregiver advise you on your care plan. The week before your visit, check your glucose before a meal and 2 hours after a meal at least daily. You may want to test before and after a different meal each day so you and your caregiver can tell how well controlled your blood sugars are throughout the course of a 24 hour period.   Gestational diabetes (diabetes during pregnancy).   Frequent testing is often necessary. Accurate timing is important.   If you are not on insulin, check your glucose 4 times a day. Check it before breakfast and 1 hour after the start of each meal.   If you are on insulin, check your glucose 6 times a day. Check it before each meal and 1 hour after the first bite of each meal.   General guidelines.   More frequent testing is required at the start of insulin treatment. Your caregiver will instruct you.   Test your glucose any time you suspect you have low blood sugar (hypoglycemia).   You should test more often when you change medicines, when you have unusual stress or illness, or in other  unusual circumstances.  OTHER THINGS TO KNOW ABOUT GLUCOSE METERS  Measurement Range. Most glucose meters are able to read glucose levels over a broad range of values from as low as 0 to as high as 600 mg/dL. If you get an extremely high or low reading from your meter, you should first confirm it with another reading. Report very high or very low readings to your caregiver.   Whole Blood Glucose versus Plasma Glucose. Some older home glucose meters measure glucose in your whole blood. In a lab or when using some newer home glucose meters, the glucose is measured in your plasma (one component of blood). The difference can be important. It is important for you and your caregiver to know whether your meter gives its results as "whole blood equivalent" or "plasma  equivalent."   Display of High and Low Glucose Values. Part of learning how to operate a meter is understanding what the meter results mean. Know how high and low glucose concentrations are displayed on your meter.   Factors that Affect Glucose Meter Performance. The accuracy of your test results depends on many factors and varies depending on the brand and type of meter. These factors include:   Low red blood cell count (anemia).   Substances in your blood (such as uric acid, vitamin C, and others).   Environmental factors (temperature, humidity, altitude).   Name-brand versus generic test strips.   Calibration. Make sure your meter is set up properly. It is a good idea to do a calibration test with a control solution recommended by the manufacturer of your meter whenever you begin using a fresh bottle of test strips. This will help verify the accuracy of your meter.   Improperly stored, expired, or defective test strips. Keep your strips in a dry place with the lid on.   Soiled meter.   Inadequate blood sample.  NEW TECHNOLOGIES FOR GLUCOSE TESTING Alternative site testing Some glucose meters allow testing blood from alternative  sites. These include the:  Upper arm.   Forearm.   Base of the thumb.   Thigh.  Sampling blood from alternative sites may be desirable. However, it may have some limitations. Blood in the fingertips show changes in glucose levels more quickly than blood in other parts of the body. This means that alternative site test results may be different from fingertip test results, not because of the meter's ability to test accurately, but because the actual glucose concentration can be different.  Continuous Glucose Monitoring Devices to measure your blood glucose continuously are available, and others are in development. These methods can be more expensive than self-monitoring with a glucose meter. However, it is uncertain how effective and reliable these devices are. Your caregiver will advise you if this approach makes sense for you. IF BLOOD SUGARS ARE CONTROLLED, PEOPLE WITH DIABETES REMAIN HEALTHIER.  SMBG is an important part of the treatment plan of patients with diabetes mellitus. Below are reasons for using SMBG:   It confirms that your glucose is at a specific, healthy level.   It detects hypoglycemia and severe hyperglycemia.   It allows you and your caregiver to make adjustments in response to changes in lifestyle for individuals requiring medicine.   It determines the need for starting insulin therapy in temporary diabetes that happens during pregnancy (gestational diabetes).  Document Released: 11/21/2003 Document Revised: 11/07/2011 Document Reviewed: 03/14/2011 [facility] Patient Information 2012 [place].Diabetes and Exercise Regular exercise is important and can help:   Control blood glucose (sugar).   Decrease blood pressure.    Control blood lipids (cholesterol, triglycerides).   Improve overall health.  BENEFITS FROM EXERCISE  Improved fitness.   Improved flexibility.   Improved endurance.   Increased bone density.   Weight control.   Increased muscle  strength.   Decreased body fat.   Improvement of the body's use of insulin, a hormone.   Increased insulin sensitivity.   Reduction of insulin needs.   Reduced stress and tension.   Helps you feel better.  People with diabetes who add exercise to their lifestyle gain additional benefits, including:  Weight loss.   Reduced appetite.   Improvement of the body's use of blood glucose.   Decreased risk factors for heart disease:   Lowering of cholesterol and triglycerides.   Raising the  level of good cholesterol (high-density lipoproteins, HDL).   Lowering blood sugar.   Decreased blood pressure.  TYPE 1 DIABETES AND EXERCISE  Exercise will usually lower your blood glucose.   If blood glucose is greater than 240 mg/dl, check urine ketones. If ketones are present, do not exercise.   Location of the insulin injection sites may need to be adjusted with exercise. Avoid injecting insulin into areas of the body that will be exercised. For example, avoid injecting insulin into:   The arms when playing tennis.   The legs when jogging. For more information, discuss this with your caregiver.   Keep a record of:   Food intake.   Type and amount of exercise.   Expected peak times of insulin action.   Blood glucose levels.  Do this before, during, and after exercise. Review your records with your caregiver. This will help you to develop guidelines for adjusting food intake and insulin amounts.  TYPE 2 DIABETES AND EXERCISE  Regular physical activity can help control blood glucose.   Exercise is important because it may:   Increase the body's sensitivity to insulin.   Improve blood glucose control.   Exercise reduces the risk of heart disease. It decreases serum cholesterol and triglycerides. It also lowers blood pressure.   Those who take insulin or oral hypoglycemic agents should watch for signs of hypoglycemia. These signs include dizziness, shaking, sweating, chills,  and confusion.   Body water is lost during exercise. It must be replaced. This will help to avoid loss of body fluids (dehydration) or heat stroke.  Be sure to talk to your caregiver before starting an exercise program to make sure it is safe for you. Remember, any activity is better than none.  Document Released: 02/08/2004 Document Revised: 11/07/2011 Document Reviewed: 05/25/2009 [facility] Patient Information 2012 [place], [place].

## 2012-03-03 NOTE — Discharge Summary (Addendum)
Patient ID: Kayla Snyder MRN: [id number] DOB/AGE: [birth year] 61 y.o.  Admit date: 02/29/2012 Discharge date: 03/03/2012  Primary Care Physician:  Eino Farber, MD, MD  Discharge Diagnoses:     Principal Problem:  *Diabetes mellitus with nonketotic hyperosmolar hyperglycemia  Active Problems:  Diabetes mellitus, new onset  Hypomagnesemia  Hypertension  Hyponatremia  Leukocytosis  Hypokalemia   Medication List  As of 03/03/2012 10:37 AM   TAKE these medications         accu-chek soft touch lancets   Use as instructed      insulin glargine 100 UNIT/ML injection   Commonly known as: LANTUS   Inject 18 Units into the skin at bedtime.      metFORMIN 500 MG tablet   Commonly known as: GLUCOPHAGE   Take 1 tablet (500 mg total) by mouth 2 (two) times daily with a meal.      Syringe (Disposable) 1 ML Misc   30 application by Does not apply route 1 day or 1 dose.                   Losartan / HCTZ 50/12.5 mg po daily             Norvasc 5 mg po daily    Disposition and Follow-up:  Home with PCP follow up  Consults: none  Significant Diagnostic Studies:  Dg Chest 2 View  03/01/2012  *RADIOLOGY REPORT*  Clinical Data: Increased urination, first, and fatigue. Questionable diabetes.  CHEST - 2 VIEW  Comparison: None.  Findings: Normal heart size and pulmonary vascularity.  No focal airspace consolidation in the lungs.  No blunting of costophrenic angles.  No pneumothorax.  Tortuous aorta.  Degenerative changes in the spine.  Surgical clips in the right upper quadrant.  IMPRESSION: No evidence of active pulmonary disease.  Original Report Authenticated By: Marlon Pel, M.D.    Brief H and P: For complete details please refer to admission H and P, but in brief [name] with h/o HTN presents with severe hyperglycemia, hyperosmolar non-ketotic -- new diagnosis  of diabetes.  Pt has no history of diabetes. She states that 2 weeks ago she developed increased thirst,   increased urination, and fatigue. She had one episode of hot flushing that self-resolved at work a  couple weeks ago, but no true fevers, chills, sweats. No cardiopulmonary symptoms of CP, SOB. No  GI issues, no n/v/d/abd pain.  In the ED, vitals were stable. Labs consistent with hypoosmolar nonketotic hyperglycemia: hypoNa  124, hypoCl 84, glucose 1072. renal 21/0.92. WBC was 15.8. VBG 7.365 / CO2 45 / O2 44 / HCO3 26.  UA with glucose, minimal ketones, no infection. CXR negative.    Physical Exam on Discharge:  Filed Vitals:   03/02/12 1420 03/02/12 2040 03/03/12 0421 03/03/12 0830  BP: 150/96 145/87 105/68 116/73  Pulse: 68 77 73 67  Temp: 98.1 F (36.7 C) 97.8 F (36.6 C) 97 F (36.1 C) 97.8 F (36.6 C)  TempSrc: Oral Oral Oral Oral  Resp: 18 18 18 18   Height:      Weight:   90.855 kg (200 lb 4.8 oz)   SpO2: 95% 97% 98% 98%     Intake/Output Summary (Last 24 hours) at 03/03/12 1037 Last data filed at 03/03/12 1024  Gross per 24 hour  Intake    513 ml  Output    825 ml  Net   -312 ml    General: Alert, awake, oriented x3, in  no acute distress. HEENT: No bruits, no goiter. Heart: Regular rate and rhythm, without murmurs, rubs, gallops. Lungs: Clear to auscultation bilaterally. Abdomen: Soft, nontender, nondistended, positive bowel sounds. Extremities: No clubbing cyanosis or edema with positive pedal pulses. Neuro: Grossly intact, nonfocal.  CBC:    Component Value Date/Time   WBC 14.3* 03/01/2012 0557   HGB 12.8 03/01/2012 0557   HCT 36.1 03/01/2012 0557   PLT 205 03/01/2012 0557   MCV 75.7* 03/01/2012 0557   NEUTROABS 13.8* 02/29/2012 2249   LYMPHSABS 0.8 02/29/2012 2249   MONOABS 1.1* 02/29/2012 2249   EOSABS 0.1 02/29/2012 2249   BASOSABS 0.0 02/29/2012 2249    Basic Metabolic Panel:    Component Value Date/Time   NA 134* 03/02/2012 1755   K 3.9 03/02/2012 1755   CL 98 03/02/2012 1755   CO2 28 03/02/2012 1755   BUN 13 03/02/2012 1755   CREATININE 0.61 03/02/2012  1755   GLUCOSE 271* 03/02/2012 1755   CALCIUM 9.1 03/02/2012 1755    Hospital Course:   *new onset Diabetes mellitus with nonketotic hyperosmolar hyperglycemia Admitted to tele with glucose stabilizer Protocol  AG closed and weaned off drip. Given 10 units lantus  started sliding scale coverage and fsg improved  Hb A1C of 13  diabetic coordinator consulted, recommend 18 units lantus daily . She informs having difficulty in taking premeal aspart due to her work schedule and will discharge her on current regimen for now.  added metformin 500 mg bid as well   Hypertension  Confirmed with PCP office. She is on losartan/ HCTZ combination and norvasc and will be discharged on same.  Leukocytosis  Possibly stress induced   Hypokalemia and hypomagnesemia Patient had a short run of v tach on tele on 4/1. Low mg and k noted and were replenished. She has be stable on tele overnight and can be dced home today. Has ppt with PCP this afternoon.  Prescriptions, diabetes supplies including glucometer provided. patient has received teaching on checking fsg and injecting insulin. Counseled on dietary and lifestyle modifications including regular exercise.      Time spent on Discharge: 45 minutes  Signed: Eddie North 03/03/2012, 10:37 AM

## 2017-02-24 DIAGNOSIS — I119 Hypertensive heart disease without heart failure: Secondary | ICD-10-CM | POA: Diagnosis not present

## 2017-02-24 DIAGNOSIS — K219 Gastro-esophageal reflux disease without esophagitis: Secondary | ICD-10-CM | POA: Diagnosis not present

## 2017-02-24 DIAGNOSIS — Z79899 Other long term (current) drug therapy: Secondary | ICD-10-CM | POA: Diagnosis not present

## 2017-02-24 DIAGNOSIS — E1165 Type 2 diabetes mellitus with hyperglycemia: Secondary | ICD-10-CM | POA: Diagnosis not present

## 2017-02-24 DIAGNOSIS — E785 Hyperlipidemia, unspecified: Secondary | ICD-10-CM | POA: Diagnosis not present

## 2017-02-24 DIAGNOSIS — E784 Other hyperlipidemia: Secondary | ICD-10-CM | POA: Diagnosis not present

## 2017-02-24 DIAGNOSIS — E669 Obesity, unspecified: Secondary | ICD-10-CM | POA: Diagnosis not present

## 2017-02-24 DIAGNOSIS — Z6837 Body mass index (BMI) 37.0-37.9, adult: Secondary | ICD-10-CM | POA: Diagnosis not present

## 2017-02-24 DIAGNOSIS — Z8249 Family history of ischemic heart disease and other diseases of the circulatory system: Secondary | ICD-10-CM | POA: Diagnosis not present

## 2017-06-10 DIAGNOSIS — Z1231 Encounter for screening mammogram for malignant neoplasm of breast: Secondary | ICD-10-CM | POA: Diagnosis not present

## 2017-06-10 DIAGNOSIS — Z803 Family history of malignant neoplasm of breast: Secondary | ICD-10-CM | POA: Diagnosis not present

## 2017-10-27 DIAGNOSIS — I119 Hypertensive heart disease without heart failure: Secondary | ICD-10-CM | POA: Diagnosis not present

## 2017-10-27 DIAGNOSIS — E1165 Type 2 diabetes mellitus with hyperglycemia: Secondary | ICD-10-CM | POA: Diagnosis not present

## 2017-10-27 DIAGNOSIS — Z79899 Other long term (current) drug therapy: Secondary | ICD-10-CM | POA: Diagnosis not present

## 2017-10-27 DIAGNOSIS — E669 Obesity, unspecified: Secondary | ICD-10-CM | POA: Diagnosis not present

## 2017-10-27 DIAGNOSIS — Z8249 Family history of ischemic heart disease and other diseases of the circulatory system: Secondary | ICD-10-CM | POA: Diagnosis not present

## 2017-10-27 DIAGNOSIS — Z23 Encounter for immunization: Secondary | ICD-10-CM | POA: Diagnosis not present

## 2017-10-27 DIAGNOSIS — Z6836 Body mass index (BMI) 36.0-36.9, adult: Secondary | ICD-10-CM | POA: Diagnosis not present

## 2017-10-27 DIAGNOSIS — E785 Hyperlipidemia, unspecified: Secondary | ICD-10-CM | POA: Diagnosis not present

## 2018-05-21 DIAGNOSIS — Z803 Family history of malignant neoplasm of breast: Secondary | ICD-10-CM | POA: Diagnosis not present

## 2018-05-21 DIAGNOSIS — Z8249 Family history of ischemic heart disease and other diseases of the circulatory system: Secondary | ICD-10-CM | POA: Diagnosis not present

## 2018-05-21 DIAGNOSIS — Z794 Long term (current) use of insulin: Secondary | ICD-10-CM | POA: Diagnosis not present

## 2018-05-21 DIAGNOSIS — Z6837 Body mass index (BMI) 37.0-37.9, adult: Secondary | ICD-10-CM | POA: Diagnosis not present

## 2018-05-21 DIAGNOSIS — Z809 Family history of malignant neoplasm, unspecified: Secondary | ICD-10-CM | POA: Diagnosis not present

## 2018-05-21 DIAGNOSIS — I10 Essential (primary) hypertension: Secondary | ICD-10-CM | POA: Diagnosis not present

## 2018-05-21 DIAGNOSIS — Z823 Family history of stroke: Secondary | ICD-10-CM | POA: Diagnosis not present

## 2018-05-21 DIAGNOSIS — E1165 Type 2 diabetes mellitus with hyperglycemia: Secondary | ICD-10-CM | POA: Diagnosis not present

## 2018-06-05 ENCOUNTER — Emergency Department (HOSPITAL_COMMUNITY): Payer: Medicare HMO

## 2018-06-05 ENCOUNTER — Inpatient Hospital Stay (HOSPITAL_COMMUNITY)
Admission: EM | Admit: 2018-06-05 | Discharge: 2018-06-07 | DRG: 872 | Disposition: A | Discharge status: Home / Self Care | Payer: Medicare HMO | Attending: Internal Medicine | Admitting: Internal Medicine

## 2018-06-05 ENCOUNTER — Other Ambulatory Visit: Payer: Self-pay

## 2018-06-05 ENCOUNTER — Encounter (HOSPITAL_COMMUNITY): Payer: Self-pay | Admitting: Family Medicine

## 2018-06-05 DIAGNOSIS — B9620 Unspecified Escherichia coli [E. coli] as the cause of diseases classified elsewhere: Secondary | ICD-10-CM | POA: Diagnosis present

## 2018-06-05 DIAGNOSIS — D509 Iron deficiency anemia, unspecified: Secondary | ICD-10-CM | POA: Diagnosis not present

## 2018-06-05 DIAGNOSIS — Z1611 Resistance to penicillins: Secondary | ICD-10-CM | POA: Diagnosis not present

## 2018-06-05 DIAGNOSIS — N179 Acute kidney failure, unspecified: Secondary | ICD-10-CM | POA: Diagnosis not present

## 2018-06-05 DIAGNOSIS — E872 Acidosis: Secondary | ICD-10-CM | POA: Diagnosis present

## 2018-06-05 DIAGNOSIS — Z79899 Other long term (current) drug therapy: Secondary | ICD-10-CM | POA: Diagnosis not present

## 2018-06-05 DIAGNOSIS — E119 Type 2 diabetes mellitus without complications: Secondary | ICD-10-CM

## 2018-06-05 DIAGNOSIS — R410 Disorientation, unspecified: Secondary | ICD-10-CM | POA: Diagnosis not present

## 2018-06-05 DIAGNOSIS — N289 Disorder of kidney and ureter, unspecified: Secondary | ICD-10-CM

## 2018-06-05 DIAGNOSIS — A419 Sepsis, unspecified organism: Secondary | ICD-10-CM | POA: Diagnosis not present

## 2018-06-05 DIAGNOSIS — Z794 Long term (current) use of insulin: Secondary | ICD-10-CM

## 2018-06-05 DIAGNOSIS — R509 Fever, unspecified: Secondary | ICD-10-CM | POA: Diagnosis not present

## 2018-06-05 DIAGNOSIS — N10 Acute tubulo-interstitial nephritis: Secondary | ICD-10-CM

## 2018-06-05 DIAGNOSIS — R531 Weakness: Secondary | ICD-10-CM | POA: Diagnosis not present

## 2018-06-05 DIAGNOSIS — N390 Urinary tract infection, site not specified: Secondary | ICD-10-CM | POA: Diagnosis present

## 2018-06-05 DIAGNOSIS — R61 Generalized hyperhidrosis: Secondary | ICD-10-CM | POA: Diagnosis not present

## 2018-06-05 DIAGNOSIS — I10 Essential (primary) hypertension: Secondary | ICD-10-CM | POA: Diagnosis present

## 2018-06-05 LAB — COMPREHENSIVE METABOLIC PANEL
ALBUMIN: 3 g/dL — AB (ref 3.5–5.0)
ALK PHOS: 61 U/L (ref 38–126)
ALT: 26 U/L (ref 0–44)
ANION GAP: 10 (ref 5–15)
AST: 28 U/L (ref 15–41)
BILIRUBIN TOTAL: 0.9 mg/dL (ref 0.3–1.2)
BUN: 32 mg/dL — AB (ref 8–23)
CALCIUM: 8.4 mg/dL — AB (ref 8.9–10.3)
CO2: 23 mmol/L (ref 22–32)
Chloride: 102 mmol/L (ref 98–111)
Creatinine, Ser: 1.53 mg/dL — ABNORMAL HIGH (ref 0.44–1.00)
GFR calc Af Amer: 39 mL/min — ABNORMAL LOW (ref >60)
GFR, EST NON AFRICAN AMERICAN: 34 mL/min — AB (ref >60)
GLUCOSE: 352 mg/dL — AB (ref 70–99)
POTASSIUM: 3.7 mmol/L (ref 3.5–5.1)
Sodium: 135 mmol/L (ref 135–145)
TOTAL PROTEIN: 7.2 g/dL (ref 6.5–8.1)

## 2018-06-05 LAB — URINALYSIS, ROUTINE W REFLEX MICROSCOPIC
BILIRUBIN URINE: NEGATIVE
Glucose, UA: >=500 mg/dL — AB
KETONES UR: NEGATIVE mg/dL
NITRITE: NEGATIVE
PH: 5 (ref 5.0–8.0)
Protein, ur: 100 mg/dL — AB
RBC / HPF: >50 RBC/hpf — ABNORMAL HIGH (ref 0–5)
SPECIFIC GRAVITY, URINE: 1.011 (ref 1.005–1.030)
WBC, UA: >50 WBC/hpf — ABNORMAL HIGH (ref 0–5)

## 2018-06-05 LAB — CBC WITH DIFFERENTIAL/PLATELET
ABS IMMATURE GRANULOCYTES: 0.1 10*3/uL (ref 0.0–0.1)
Basophils Absolute: 0.1 10*3/uL (ref 0.0–0.1)
Basophils Relative: 0 %
Eosinophils Absolute: 0 10*3/uL (ref 0.0–0.7)
Eosinophils Relative: 0 %
HCT: 32.6 % — ABNORMAL LOW (ref 36.0–46.0)
HEMOGLOBIN: 11 g/dL — AB (ref 12.0–15.0)
IMMATURE GRANULOCYTES: 1 %
LYMPHS ABS: 0.5 10*3/uL — AB (ref 0.7–4.0)
Lymphocytes Relative: 3 %
MCH: 26.5 pg (ref 26.0–34.0)
MCHC: 33.7 g/dL (ref 30.0–36.0)
MCV: 78.6 fL (ref 78.0–100.0)
MONOS PCT: 8 %
Monocytes Absolute: 1.3 10*3/uL — ABNORMAL HIGH (ref 0.1–1.0)
Neutro Abs: 15.2 10*3/uL — ABNORMAL HIGH (ref 1.7–7.7)
Neutrophils Relative %: 88 %
PLATELETS: 218 10*3/uL (ref 150–400)
RBC: 4.15 MIL/uL (ref 3.87–5.11)
RDW: 16.2 % — ABNORMAL HIGH (ref 11.5–15.5)
WBC: 17.2 10*3/uL — ABNORMAL HIGH (ref 4.0–10.5)

## 2018-06-05 LAB — SODIUM, URINE, RANDOM: SODIUM UR: 54 mmol/L

## 2018-06-05 LAB — GLUCOSE, CAPILLARY
GLUCOSE-CAPILLARY: 344 mg/dL — AB (ref 70–99)
GLUCOSE-CAPILLARY: 234 mg/dL — AB (ref 70–99)
Glucose-Capillary: 212 mg/dL — ABNORMAL HIGH (ref 70–99)
Glucose-Capillary: 226 mg/dL — ABNORMAL HIGH (ref 70–99)

## 2018-06-05 LAB — LACTIC ACID, PLASMA
Lactic Acid, Venous: 1.3 mmol/L (ref 0.5–1.9)
Lactic Acid, Venous: 1.8 mmol/L (ref 0.5–1.9)

## 2018-06-05 LAB — I-STAT CG4 LACTIC ACID, ED
LACTIC ACID, VENOUS: 2.16 mmol/L — AB (ref 0.5–1.9)
LACTIC ACID, VENOUS: 1.54 mmol/L (ref 0.5–1.9)

## 2018-06-05 LAB — CREATININE, URINE, RANDOM: Creatinine, Urine: 127.63 mg/dL

## 2018-06-05 LAB — HIV ANTIBODY (ROUTINE TESTING W REFLEX): HIV Screen 4th Generation wRfx: NONREACTIVE

## 2018-06-05 MED ORDER — ACETAMINOPHEN 650 MG RE SUPP: 650.0000 mg | Freq: Four times a day (QID) | RECTAL | Status: DC | PRN

## 2018-06-05 MED ORDER — INSULIN ASPART 100 UNIT/ML ~~LOC~~ SOLN
0.0000 [IU] | Freq: Every day | SUBCUTANEOUS | Status: DC
  Administered 2018-06-05 – 2018-06-06 (×2): 2 [IU] via SUBCUTANEOUS

## 2018-06-05 MED ORDER — HEPARIN SODIUM (PORCINE) 5000 UNIT/ML IJ SOLN
5000.0000 [IU] | Freq: Three times a day (TID) | INTRAMUSCULAR | Status: DC
  Administered 2018-06-05 – 2018-06-07 (×7): 5000 [IU] via SUBCUTANEOUS
  Filled 2018-06-05 (×5): qty 1

## 2018-06-05 MED ORDER — AMLODIPINE BESYLATE 5 MG PO TABS
5.0000 mg | ORAL_TABLET | Freq: Every day | ORAL | Status: DC
  Administered 2018-06-05 – 2018-06-07 (×3): 5 mg via ORAL
  Filled 2018-06-05 (×3): qty 1

## 2018-06-05 MED ORDER — ACETAMINOPHEN 325 MG PO TABS: 650.0000 mg | ORAL_TABLET | Freq: Four times a day (QID) | ORAL | Status: DC | PRN

## 2018-06-05 MED ORDER — SODIUM CHLORIDE 0.9% FLUSH
3.0000 mL | Freq: Two times a day (BID) | INTRAVENOUS | Status: DC
  Administered 2018-06-05 – 2018-06-06 (×4): 3 mL via INTRAVENOUS

## 2018-06-05 MED ORDER — SODIUM CHLORIDE 0.9% FLUSH
3.0000 mL | Freq: Two times a day (BID) | INTRAVENOUS | Status: DC
  Administered 2018-06-05: 3 mL via INTRAVENOUS

## 2018-06-05 MED ORDER — SODIUM CHLORIDE 0.9 % IV SOLN
250.0000 mL | INTRAVENOUS | Status: DC | PRN
  Administered 2018-06-05: 250 mL via INTRAVENOUS

## 2018-06-05 MED ORDER — ONDANSETRON HCL 4 MG PO TABS: 4.0000 mg | ORAL_TABLET | Freq: Four times a day (QID) | ORAL | Status: DC | PRN

## 2018-06-05 MED ORDER — HYDROCODONE-ACETAMINOPHEN 5-325 MG PO TABS: 1.0000 | ORAL_TABLET | ORAL | Status: DC | PRN

## 2018-06-05 MED ORDER — LEVOFLOXACIN IN D5W 750 MG/150ML IV SOLN
750.0000 mg | Freq: Once | INTRAVENOUS | Status: AC
  Administered 2018-06-05: 750 mg via INTRAVENOUS
  Filled 2018-06-05: qty 150

## 2018-06-05 MED ORDER — INSULIN ASPART 100 UNIT/ML ~~LOC~~ SOLN
0.0000 [IU] | Freq: Three times a day (TID) | SUBCUTANEOUS | Status: DC
  Administered 2018-06-05: 3 [IU] via SUBCUTANEOUS
  Administered 2018-06-05: 7 [IU] via SUBCUTANEOUS
  Administered 2018-06-05: 3 [IU] via SUBCUTANEOUS
  Administered 2018-06-06: 1 [IU] via SUBCUTANEOUS
  Administered 2018-06-06 (×2): 3 [IU] via SUBCUTANEOUS
  Administered 2018-06-07: 1 [IU] via SUBCUTANEOUS

## 2018-06-05 MED ORDER — ONDANSETRON HCL 4 MG/2ML IJ SOLN: 4.0000 mg | Freq: Four times a day (QID) | INTRAMUSCULAR | Status: DC | PRN

## 2018-06-05 MED ORDER — SODIUM CHLORIDE 0.9 % IV SOLN
2.0000 g | INTRAVENOUS | Status: DC
  Administered 2018-06-05 – 2018-06-07 (×3): 2 g via INTRAVENOUS
  Filled 2018-06-05 (×3): qty 20

## 2018-06-05 MED ORDER — SODIUM CHLORIDE 0.9 % IV SOLN
INTRAVENOUS | Status: DC
  Administered 2018-06-05 – 2018-06-07 (×4): via INTRAVENOUS

## 2018-06-05 MED ORDER — SODIUM CHLORIDE 0.9% FLUSH: 3.0000 mL | INTRAVENOUS | Status: DC | PRN

## 2018-06-05 MED ORDER — SENNOSIDES-DOCUSATE SODIUM 8.6-50 MG PO TABS: 1.0000 | ORAL_TABLET | Freq: Every evening | ORAL | Status: DC | PRN

## 2018-06-05 MED ORDER — INSULIN GLARGINE 100 UNIT/ML ~~LOC~~ SOLN
12.0000 [IU] | Freq: Every day | SUBCUTANEOUS | Status: DC
  Administered 2018-06-05 – 2018-06-06 (×2): 12 [IU] via SUBCUTANEOUS
  Filled 2018-06-05 (×2): qty 0.12

## 2018-06-05 NOTE — ED Triage Notes (Signed)
Pt to ED from home c/o burning with urination x1 week. Today pt more weak with ams per family. Pt hot and diaphoretic with EMS. HR 160 to 120 with resolution of diaphoresis after 500 NS bolus.. RR 40s. O2 80s room air. Hx DM, cbg 393.  NAD. Denies cp, sob. Pt A/O x4 on arrival.

## 2018-06-05 NOTE — Progress Notes (Signed)
The patient was admitted early this morning after midnight and H&P has been reviewed and I am in current agreement with assessment and plan done by Dr. Odie Seraimothy Opyd.  Additional changes to the plan of care been made accordingly.  Patient is a 67 year old female with past medical history significant for insulin-dependent diabetes mellitus, hypertension, and other comorbidities who presented to the emergency room for evaluation of dysuria, malaise, reported confusion.  She is admitted to the hospital for the treatment of sepsis secondary to urinary tract infection.  Urinalysis done admission showed a cloudy appearance with greater than 500 glucose, large hemoglobin of the urine dipstick, large leukocytes, negative nitrites, few bacteria, greater than 50 RBCs per high-power field, and greater than 50 WBCs.  Unfortunately no urine culture was sent at this time and we have requested to add on another one.  She was given IV levofloxacin in the emergency room and we will change this to IV ceftriaxone currently and will continue until sensitivities have resulted.  Patient's lactic acidosis has resolved but she still has an AKI so will continue IV fluid hydration at a rate of 75 mL's per hour with normal saline.  We will follow-up on blood culture and urine culture results and continue empiric antibiotics at this time.  We will continue to monitor patient's clinical response to intervention and repeat blood work in the morning.

## 2018-06-05 NOTE — Plan of Care (Signed)
Pt admitted to room 70M 01, A&O x 4 and denying any SOB, Cp, or discomfort. Admission completed, pt and spouse oriented to room. Cardiac monitor, IV abx initiated as ordered, pt resting quietly at this time.

## 2018-06-05 NOTE — ED Provider Notes (Signed)
[facility] Provider Note   CSN: [id number] Arrival date & time: 06/05/18  0131     History   Chief Complaint No chief complaint on file.   HPI Kayla Snyder is a 67 y.o. female.  Patient is a 67 year old female with past medical history of diabetes, hypertension.  She presents today for evaluation of foul-smelling urine for the past 2 days, dysuria, and now is feeling chilled.  She denies any fevers at home.  She denies any abdominal pain, chest pain, or cough.  The history is provided by the patient.  Dysuria   This is a new problem. The current episode started 2 days ago. The problem occurs every urination. The problem has been gradually worsening. The quality of the pain is described as burning. The pain is moderate. There has been no fever. Associated symptoms include chills. Pertinent negatives include no vomiting. She has tried nothing for the symptoms.    Past Medical History:  Diagnosis Date  . Diabetes mellitus   . Hypertension     Patient Active Problem List   Diagnosis Date Noted  . Sepsis secondary to UTI (HCC) 06/05/2018  . Insulin-requiring or dependent type II diabetes mellitus (HCC) 06/05/2018  . Renal insufficiency 06/05/2018  . Leukocytosis 03/01/2012  . Hypertension     Past Surgical History:  Procedure Laterality Date  . APPENDECTOMY       OB History   None      Home Medications    Prior to Admission medications   Medication Sig Start Date End Date Taking? Authorizing Provider  amLODipine (NORVASC) 5 MG tablet Take 1 tablet (5 mg total) by mouth daily. 03/03/12 03/03/13  Dhungel, Nishant, MD  insulin glargine (LANTUS) 100 UNIT/ML injection Inject 18 Units into the skin at bedtime. 03/03/12 03/03/13  Dhungel, Theda BelfastNishant, MD  losartan-hydrochlorothiazide (HYZAAR) 50-12.5 MG per tablet Take 1 tablet by mouth daily. 03/03/12 03/03/13  Dhungel, Theda BelfastNishant, MD  metFORMIN (GLUCOPHAGE) 500 MG tablet Take 1 tablet (500 mg  total) by mouth 2 (two) times daily with a meal. 03/03/12 03/03/13  Dhungel, Nishant, MD  Syringe, Disposable, 1 ML MISC 30 application by Does not apply route 1 day or 1 dose. 03/03/12   Dhungel, Theda BelfastNishant, MD    Family History History reviewed. No pertinent family history.  Social History Social History   Tobacco Use  . Smoking status: Never Smoker  . Smokeless tobacco: Never Used  Substance Use Topics  . Alcohol use: No  . Drug use: No     Allergies   Patient has no known allergies.   Review of Systems Review of Systems  Constitutional: Positive for chills.  Gastrointestinal: Negative for vomiting.  Genitourinary: Positive for dysuria.  All other systems reviewed and are negative.    Physical Exam Updated Vital Signs BP 117/61   Pulse (!) 110   Temp 100.3 F (37.9 C) (Oral)   Resp (!) 38   Ht 5\' 2"  (1.575 m)   Wt 93 kg (205 lb)   SpO2 94%   BMI 37.49 kg/m   Physical Exam  Constitutional: She is oriented to person, place, and time. She appears well-developed and well-nourished. No distress.  HENT:  Head: Normocephalic and atraumatic.  Neck: Normal range of motion. Neck supple.  Cardiovascular: Normal rate and regular rhythm. Exam reveals no gallop and no friction rub.  No murmur heard. Pulmonary/Chest: Effort normal and breath sounds normal. No respiratory distress. She has no wheezes.  Abdominal: Soft. Bowel sounds  are normal. She exhibits no distension. There is no tenderness.  Musculoskeletal: Normal range of motion.  Neurological: She is alert and oriented to person, place, and time.  Skin: Skin is warm and dry. She is not diaphoretic.  Nursing note and vitals reviewed.    ED Treatments / Results  Labs (all labs ordered are listed, but only abnormal results are displayed) Labs Reviewed  COMPREHENSIVE METABOLIC PANEL - Abnormal; Notable for the following components:      Result Value   Glucose, Bld 352 (*)    BUN 32 (*)    Creatinine, Ser 1.53 (*)     Calcium 8.4 (*)    Albumin 3.0 (*)    GFR calc non Af Amer 34 (*)    GFR calc Af Amer 39 (*)    All other components within normal limits  CBC WITH DIFFERENTIAL/PLATELET - Abnormal; Notable for the following components:   WBC 17.2 (*)    Hemoglobin 11.0 (*)    HCT 32.6 (*)    RDW 16.2 (*)    Neutro Abs 15.2 (*)    Lymphs Abs 0.5 (*)    Monocytes Absolute 1.3 (*)    All other components within normal limits  URINALYSIS, ROUTINE W REFLEX MICROSCOPIC - Abnormal; Notable for the following components:   APPearance CLOUDY (*)    Glucose, UA >=500 (*)    Hgb urine dipstick LARGE (*)    Protein, ur 100 (*)    Leukocytes, UA LARGE (*)    RBC / HPF >50 (*)    WBC, UA >50 (*)    Bacteria, UA FEW (*)    All other components within normal limits  I-STAT CG4 LACTIC ACID, ED - Abnormal; Notable for the following components:   Lactic Acid, Venous 2.16 (*)    All other components within normal limits  CULTURE, BLOOD (ROUTINE X 2)  CULTURE, BLOOD (ROUTINE X 2)  URINE CULTURE  I-STAT CG4 LACTIC ACID, ED    EKG None  Radiology Dg Chest 2 View  Result Date: 06/05/2018 CLINICAL DATA:  Sepsis EXAM: CHEST - 2 VIEW COMPARISON:  03/01/2012 FINDINGS: No consolidation or effusion. Borderline cardiomegaly with vascular congestion and hazy bibasilar atelectasis or edema. No pneumothorax. IMPRESSION: Borderline to mild cardiomegaly with vascular congestion and hazy bibasilar atelectasis versus minimal edema. Electronically Signed   By: Jasmine Pang M.D.   On: 06/05/2018 02:15    Procedures Procedures (including critical care time)  Medications Ordered in ED Medications  levofloxacin (LEVAQUIN) IVPB 750 mg (750 mg Intravenous New Bag/Given 06/05/18 0417)     Initial Impression / Assessment and Plan / ED Course  I have reviewed the triage vital signs and the nursing notes.  Pertinent labs & imaging results that were available during my care of the patient were reviewed by me and considered in my  medical decision making (see chart for details).  Patient's urinalysis reveals a urinary tract infection.  She has a low-grade fever and elevated white count.  Blood cultures were obtained.  Remainder of laboratory studies show acute kidney injury and hyperglycemia, but no DKA.  She will be admitted to the hospitalist service under the care of Dr. Antionette Char.  She was given Levaquin and blood cultures are pending.  Final Clinical Impressions(s) / ED Diagnoses   Final diagnoses:  None    ED Discharge Orders    None       Geoffery Lyons, MD 06/05/18 [phone]

## 2018-06-05 NOTE — ED Notes (Signed)
Patient transported to X-ray 

## 2018-06-05 NOTE — H&P (Signed)
History and Physical    Leslieann Whisman ZOX:[id number] DOB: 10/04/1951 DOA: 06/05/2018  PCP: Corine Shelter, MD   Patient coming from: Home  Chief Complaint: Dysuria, malaise   HPI: Melony Tenpas is a 67 y.o. female with medical history significant for insulin-dependent diabetes mellitus and hypertension, now presenting to the emergency department for evaluation of dysuria, malaise, and mild confusion reported by family.  Patient reports developing dysuria approximately 1 week ago with the insidious development of a general malaise over the past few days.  Family reports that she seemed to be lethargic today and slightly confused at times.  Patient has not felt febrile or taken her temperature.  She denies flank pain.  She denies shortness of breath, cough, or leg swelling.  She has not been on antibiotics recently.  EMS was called out to her house where she was found to be tachycardic and treated with 500 cc normal saline prior to arrival in the ED.  ED Course: Upon arrival to the ED, patient is found to have a temp of 37.9 C, saturating well on room air, slightly tachycardic and tachypneic with stable blood pressure.  Chemistry panel is notable for glucose of 352 and serum creatinine of 1.53 with no prior labs from the past few years.  CBC is notable for leukocytosis to 17,200 and a mild anemia with hemoglobin of 11.0.  Lactic acid is mildly elevated to 2.16.  Urinalysis is suggestive of infection.  Chest x-ray is notable for borderline to mild cardiomegaly with vascular congestion and possibly minimal edema at the bases.  Blood cultures were collected in the emergency department and the patient was treated with a dose of IV Levaquin.  Tachycardia resolved, blood pressure remained stable, there is no apparent respiratory distress, and she will be admitted for ongoing evaluation and management of sepsis secondary to UTI.  Review of Systems:  All other systems reviewed and apart from HPI, are  negative.  Past Medical History:  Diagnosis Date  . Diabetes mellitus   . Hypertension     Past Surgical History:  Procedure Laterality Date  . APPENDECTOMY       reports that she has never smoked. She has never used smokeless tobacco. She reports that she does not drink alcohol or use drugs.  No Known Allergies  History reviewed. No pertinent family history.   Prior to Admission medications   Medication Sig Start Date End Date Taking? Authorizing Provider  amLODipine (NORVASC) 5 MG tablet Take 1 tablet (5 mg total) by mouth daily. 03/03/12 03/03/13  Dhungel, Nishant, MD  insulin glargine (LANTUS) 100 UNIT/ML injection Inject 18 Units into the skin at bedtime. 03/03/12 03/03/13  Dhungel, Theda Belfast, MD  losartan-hydrochlorothiazide (HYZAAR) 50-12.5 MG per tablet Take 1 tablet by mouth daily. 03/03/12 03/03/13  Dhungel, Theda Belfast, MD  metFORMIN (GLUCOPHAGE) 500 MG tablet Take 1 tablet (500 mg total) by mouth 2 (two) times daily with a meal. 03/03/12 03/03/13  Dhungel, Nishant, MD  Syringe, Disposable, 1 ML MISC 30 application by Does not apply route 1 day or 1 dose. 03/03/12   Eddie North, MD    Physical Exam: Vitals:   06/05/18 0141 06/05/18 0215 06/05/18 0230 06/05/18 0245  BP: 118/67 121/74 119/66 117/61  Pulse: (!) 113 (!) 112 (!) 108 (!) 110  Resp: (!) 30 (!) 21 (!) 21 (!) 38  Temp: 100.3 F (37.9 C)     TempSrc: Oral     SpO2: 96% 96% 94% 94%  Weight:  Height:          Constitutional: NAD, calm  Eyes: PERTLA, lids and conjunctivae normal ENMT: Mucous membranes are moist. Posterior pharynx clear of any exudate or lesions.   Neck: normal, supple, no masses, no thyromegaly Respiratory: clear to auscultation bilaterally, no wheezing, no crackles. Normal respiratory effort.    Cardiovascular: Rate ~110 and regular. No extremity edema. No significant JVD. Abdomen: No distension, soft, non-tender. Bowel sounds active.  Musculoskeletal: no clubbing / cyanosis. No joint deformity  upper and lower extremities.   Skin: no significant rashes, lesions, ulcers. Warm, dry, well-perfused. Neurologic: CN 2-12 grossly intact. Sensation intact. Strength 5/5 in all 4 limbs.  Psychiatric: Alert and oriented x 3. Pleasant and cooperative.     Labs on Admission: I have personally reviewed following labs and imaging studies  CBC: Recent Labs  Lab 06/05/18 0141  WBC 17.2*  NEUTROABS 15.2*  HGB 11.0*  HCT 32.6*  MCV 78.6  PLT 218   Basic Metabolic Panel: Recent Labs  Lab 06/05/18 0141  NA 135  K 3.7  CL 102  CO2 23  GLUCOSE 352*  BUN 32*  CREATININE 1.53*  CALCIUM 8.4*   GFR: Estimated Creatinine Clearance: 37.9 mL/min (A) (by C-G formula based on SCr of 1.53 mg/dL (H)). Liver Function Tests: Recent Labs  Lab 06/05/18 0141  AST 28  ALT 26  ALKPHOS 61  BILITOT 0.9  PROT 7.2  ALBUMIN 3.0*   No results for input(s): LIPASE, AMYLASE in the last 168 hours. No results for input(s): AMMONIA in the last 168 hours. Coagulation Profile: No results for input(s): INR, PROTIME in the last 168 hours. Cardiac Enzymes: No results for input(s): CKTOTAL, CKMB, CKMBINDEX, TROPONINI in the last 168 hours. BNP (last 3 results) No results for input(s): PROBNP in the last 8760 hours. HbA1C: No results for input(s): HGBA1C in the last 72 hours. CBG: No results for input(s): GLUCAP in the last 168 hours. Lipid Profile: No results for input(s): CHOL, HDL, LDLCALC, TRIG, CHOLHDL, LDLDIRECT in the last 72 hours. Thyroid Function Tests: No results for input(s): TSH, T4TOTAL, FREET4, T3FREE, THYROIDAB in the last 72 hours. Anemia Panel: No results for input(s): VITAMINB12, FOLATE, FERRITIN, TIBC, IRON, RETICCTPCT in the last 72 hours. Urine analysis:    Component Value Date/Time   COLORURINE YELLOW 06/05/2018 0316   APPEARANCEUR CLOUDY (A) 06/05/2018 0316   LABSPEC 1.011 06/05/2018 0316   PHURINE 5.0 06/05/2018 0316   GLUCOSEU >=500 (A) 06/05/2018 0316   HGBUR LARGE  (A) 06/05/2018 0316   BILIRUBINUR NEGATIVE 06/05/2018 0316   KETONESUR NEGATIVE 06/05/2018 0316   PROTEINUR 100 (A) 06/05/2018 0316   UROBILINOGEN 0.2 03/01/2012 0018   NITRITE NEGATIVE 06/05/2018 0316   LEUKOCYTESUR LARGE (A) 06/05/2018 0316   Sepsis Labs: @LABRCNTIP (procalcitonin:4,lacticidven:4) )No results found for this or any previous visit (from the past 240 hour(s)).   Radiological Exams on Admission: Dg Chest 2 View  Result Date: 06/05/2018 CLINICAL DATA:  Sepsis EXAM: CHEST - 2 VIEW COMPARISON:  03/01/2012 FINDINGS: No consolidation or effusion. Borderline cardiomegaly with vascular congestion and hazy bibasilar atelectasis or edema. No pneumothorax. IMPRESSION: Borderline to mild cardiomegaly with vascular congestion and hazy bibasilar atelectasis versus minimal edema. Electronically Signed   By: Jasmine PangKim  Fujinaga M.D.   On: 06/05/2018 02:15    EKG: Not performed.   Assessment/Plan   1. Sepsis secondary to UTI  - Presents with dysuria, malaise, and mild confusion reported by family  - Found to be tachycardic with leukocytosis, elevated  lactate, and UA consistent with infection - Blood cultures collected in ED and empiric abx started  - Send urine for culture, continue empiric abx, follow cultures, trend lactate, follow clinical course    2. Insulin-dependent DM  - No recent A1c on file, was 13.0% in 2013  - Managed at home with Lantus 18 units qHS and metformin  - Follow CBG's, continue Lantus with 12 units qHS and start SSI with Novolog   3. Hypertension  - BP at goal  - Continue Norvasc as tolerated  - Hold losartan-HCTZ initially given elevated creatinine with unknown baseline    4. Renal insufficiency  - SCr is 1.53 on admission; was normal on most recent prior chem panel from 2013  - Treated with 500 cc NS by EMS pta  - Check urine chemistries, renally-dose medications, hold losartan-HCTZ and repeat chemistries tomorrow    DVT prophylaxis: sq heparin  Code  Status: Full  Family Communication: Husband updated at bedside Consults called: None Admission status: Inpatient     Briscoe Deutscher, MD [facility] Pager [phone]  If 7PM-7AM, please contact night-coverage [email or web address] Password [facility]  06/05/2018, 4:37 AM

## 2018-06-06 LAB — COMPREHENSIVE METABOLIC PANEL
ALT: 24 U/L (ref 0–44)
AST: 27 U/L (ref 15–41)
Albumin: 2.6 g/dL — ABNORMAL LOW (ref 3.5–5.0)
Alkaline Phosphatase: 47 U/L (ref 38–126)
Anion gap: 9 (ref 5–15)
BILIRUBIN TOTAL: 0.6 mg/dL (ref 0.3–1.2)
BUN: 22 mg/dL (ref 8–23)
CO2: 23 mmol/L (ref 22–32)
CREATININE: 1.03 mg/dL — AB (ref 0.44–1.00)
Calcium: 8.4 mg/dL — ABNORMAL LOW (ref 8.9–10.3)
Chloride: 108 mmol/L (ref 98–111)
GFR calc Af Amer: >60 mL/min (ref >60)
GFR, EST NON AFRICAN AMERICAN: 55 mL/min — AB (ref >60)
GLUCOSE: 124 mg/dL — AB (ref 70–99)
POTASSIUM: 3.8 mmol/L (ref 3.5–5.1)
Sodium: 140 mmol/L (ref 135–145)
TOTAL PROTEIN: 7 g/dL (ref 6.5–8.1)

## 2018-06-06 LAB — MAGNESIUM: Magnesium: 1.3 mg/dL — ABNORMAL LOW (ref 1.7–2.4)

## 2018-06-06 LAB — CBC WITH DIFFERENTIAL/PLATELET
Abs Immature Granulocytes: 0 10*3/uL (ref 0.0–0.1)
Basophils Absolute: 0 10*3/uL (ref 0.0–0.1)
Basophils Relative: 0 %
EOS ABS: 0.1 10*3/uL (ref 0.0–0.7)
EOS PCT: 1 %
HEMATOCRIT: 30.1 % — AB (ref 36.0–46.0)
HEMOGLOBIN: 10.2 g/dL — AB (ref 12.0–15.0)
Immature Granulocytes: 0 %
LYMPHS PCT: 16 %
Lymphs Abs: 1.8 10*3/uL (ref 0.7–4.0)
MCH: 26.5 pg (ref 26.0–34.0)
MCHC: 33.9 g/dL (ref 30.0–36.0)
MCV: 78.2 fL (ref 78.0–100.0)
MONO ABS: 1.7 10*3/uL — AB (ref 0.1–1.0)
Monocytes Relative: 15 %
Neutro Abs: 8 10*3/uL — ABNORMAL HIGH (ref 1.7–7.7)
Neutrophils Relative %: 68 %
Platelets: 214 10*3/uL (ref 150–400)
RBC: 3.85 MIL/uL — AB (ref 3.87–5.11)
RDW: 16.1 % — AB (ref 11.5–15.5)
WBC: 11.8 10*3/uL — AB (ref 4.0–10.5)

## 2018-06-06 LAB — GLUCOSE, CAPILLARY
GLUCOSE-CAPILLARY: 215 mg/dL — AB (ref 70–99)
Glucose-Capillary: 139 mg/dL — ABNORMAL HIGH (ref 70–99)
Glucose-Capillary: 228 mg/dL — ABNORMAL HIGH (ref 70–99)
Glucose-Capillary: 215 mg/dL — ABNORMAL HIGH (ref 70–99)

## 2018-06-06 LAB — PHOSPHORUS: Phosphorus: 2.7 mg/dL (ref 2.5–4.6)

## 2018-06-06 LAB — UREA NITROGEN, URINE: UREA NITROGEN UR: 558 mg/dL

## 2018-06-06 MED ORDER — MAGNESIUM SULFATE 50 % IJ SOLN
3.0000 g | Freq: Once | INTRAVENOUS | Status: AC
  Administered 2018-06-06: 3 g via INTRAVENOUS
  Filled 2018-06-06: qty 6

## 2018-06-06 NOTE — Progress Notes (Signed)
PROGRESS NOTE    Kayla Snyder  ZOX:[id number] DOB: 1951/03/21 DOA: 06/05/2018 PCP: Corine Shelter, MD   Brief Narrative:  Patient is a 67 year old female with past medical history significant for insulin-dependent diabetes mellitus, hypertension, and other comorbidities who presented to the emergency room for evaluation of dysuria, malaise, reported confusion.  She is admitted to the hospital for the treatment of sepsis secondary to urinary tract infection.  Urinalysis done admission showed a cloudy appearance with greater than 500 glucose, large hemoglobin of the urine dipstick, large leukocytes, negative nitrites, few bacteria, greater than 50 RBCs per high-power field, and greater than 50 WBCs.  Unfortunately no urine culture was sent at this time and we have requested to add on another one.  She was given IV levofloxacin in the emergency room and we will change this to IV ceftriaxone currently and will continue until sensitivities have resulted.  Patient's lactic acidosis has resolved but she still has an AKI so will continue IV fluid hydration at a rate of 75 mL's per hour with normal saline.  We will follow-up on blood culture and urine culture results and continue empiric antibiotics at this time.  We will continue to monitor patient's clinical response to intervention and repeat blood work in the morning.  Assessment & Plan:   Principal Problem:   Sepsis secondary to UTI [facility]) Active Problems:   Hypertension   Insulin-requiring or dependent type II diabetes mellitus (HCC)   Renal insufficiency  Sepsis secondary to UTI  -Presented with dysuria, malaise, and mild confusion reported by family  -Found to be tachycardic with leukocytosis, elevated lactate, and UA consistent with infection -Sepsis physiology is improving -Blood cultures collected in ED and empiric abx started -Urinalysis on admission showed cloudy appearance, greater than 500 glucose, large hemoglobin, large leukocytes,  negative nitrites, few bacteria, greater than 50 WBCs -Urine culture was sent and is still pending read however my discussion with the microbiology department the initial read is greater than 100,000 colonies of gram-negative rods with speciation pending to be resulted later this afternoon along with sensitivities to be resulted tomorrow -Lactic acid level improved from 2.16 now 1.8 -WBC improved from 17.2 is now 11.8 -Continue with empiric IV Ceftriaxone for now and narrow based on sensitivity results -Started IV fluid hydration with normal saline at rate of 75 mils per hour we will continue today  Insulin-dependent DM  -No recent A1c on file, was 13.0% in 2013  - Managed at home with Lantus 18 units qHS and metformin  -Continue Lantus with 12 units qHS and start sensitive NovoLog sliding scale insulin before meals and at bedtime -CBGs ranging from 1 39-2 34  Hypertension  - BP at goal  and was 117/77 this morning - Continue Amlodipine 5 mg p.o. daily -Continue to Hold losartan-HCTZ initially given elevated creatinine with unknown baseline    Renal Insufficiency/AKI  -SCr is 1.53 on admission; was normal on most recent prior chem panel from 2013  -Treated with 500 cc NS by EMS pta; now on IV fluid hydration with normal saline rate of 75 mils per hour -BUN/creatinine has now improved and is now 22/1.03 -Checking urine chemistries -Renally-dose medications and avoid Nephrotoxic Medications if possible, -Continue to Hold Losartan-HCTZ -Change to monitor repeat CMP in a.m.  Hypomagnesemia -Patient's Mag Level this AM was 1.3 -Replete with Mag Sulfate 3 grams -Continue to Monitor and Replete as Necessary -Repeat Mag Level in AM   Microcytic Anemia -Patient's hemoglobin/hematocrit 0.0/32.6 on admission is now 10.2/30.1 -  Check anemia panel in a.m. -Continue to monitor for signs and symptoms of bleeding -Repeat CBC in a.m.  DVT prophylaxis: Heparin 5000 units subcu every 8  hours Code Status: FULL CODE Family Communication: No family present at bedside Disposition Plan: Anticipate discharge home in the next 24 to 48 hours pending Urine Cx Results  Consultants:   None   Procedures:  None  Antimicrobials:  Anti-infectives (From admission, onward)   Start     Dose/Rate Route Frequency Ordered Stop   06/05/18 0600  cefTRIAXone (ROCEPHIN) 2 g in sodium chloride 0.9 % 100 mL IVPB     2 g 200 mL/hr over 30 Minutes Intravenous Every 24 hours 06/05/18 0437     06/05/18 0415  levofloxacin (LEVAQUIN) IVPB 750 mg     750 mg 100 mL/hr over 90 Minutes Intravenous  Once 06/05/18 0403 06/05/18 0547     Subjective: Seen and examined at bedside and was feeling well this morning.  Denying chest pain, shortness breath, nausea, vomiting.  Was wanting to know when she can go home.  No other complaints or concerns at this time  Objective: Vitals:   06/05/18 2011 06/05/18 2115 06/06/18 0629 06/06/18 0739  BP: 107/72  117/72 117/77  Pulse: 67  61 63  Resp: 19  19 16   Temp: 97.7 F (36.5 C)  97.7 F (36.5 C) 97.7 F (36.5 C)  TempSrc: Oral  Oral Oral  SpO2: 99%  97% 97%  Weight:  92.5 kg (203 lb 14.4 oz)    Height:        Intake/Output Summary (Last 24 hours) at 06/06/2018 0745 Last data filed at 06/06/2018 1610 Gross per 24 hour  Intake 1818.88 ml  Output 200 ml  Net 1618.88 ml   Filed Weights   06/05/18 0137 06/05/18 0531 06/05/18 2115  Weight: 93 kg (205 lb) 94.2 kg (207 lb 10.8 oz) 92.5 kg (203 lb 14.4 oz)   Examination: Physical Exam:  Constitutional: WN/WD obese African-American female who is an NAD and appears calm and comfortable Eyes:  Lids and conjunctivae normal, sclerae anicteric  ENMT: External Ears, Nose appear normal. Grossly normal hearing.  Neck: Appears normal, supple, no cervical masses, normal ROM, no appreciable thyromegaly, no JVD Respiratory: Diminished to auscultation bilaterally, no wheezing, rales, rhonchi or crackles. Normal  respiratory effort and patient is not tachypenic. No accessory muscle use.  Cardiovascular: RRR, no murmurs / rubs / gallops. S1 and S2 auscultated. Trace extremity edema.  Abdomen: Soft, non-tender, non-distended. No masses palpated. No appreciable hepatosplenomegaly. Bowel sounds positive x4.  GU: Deferred. Musculoskeletal: No clubbing / cyanosis of digits/nails. No joint deformity upper and lower extremities.  Skin: No rashes, lesions, ulcers on a limited skin eval. No induration; Warm and dry.  Neurologic: CN 2-12 grossly intact with no focal deficits.  Romberg sign and cerebellar reflexes not assessed.  Psychiatric: Normal judgment and insight. Alert and oriented x 3. Pleasant mood and appropriate affect.   Data Reviewed: I have personally reviewed following labs and imaging studies  CBC: Recent Labs  Lab 06/05/18 0141 06/06/18 0510  WBC 17.2* 11.8*  NEUTROABS 15.2* 8.0*  HGB 11.0* 10.2*  HCT 32.6* 30.1*  MCV 78.6 78.2  PLT 218 214   Basic Metabolic Panel: Recent Labs  Lab 06/05/18 0141 06/06/18 0510  NA 135 140  K 3.7 3.8  CL 102 108  CO2 23 23  GLUCOSE 352* 124*  BUN 32* 22  CREATININE 1.53* 1.03*  CALCIUM 8.4* 8.4*  MG  --  1.3*  PHOS  --  2.7   GFR: Estimated Creatinine Clearance: 56.1 mL/min (A) (by C-G formula based on SCr of 1.03 mg/dL (H)). Liver Function Tests: Recent Labs  Lab 06/05/18 0141 06/06/18 0510  AST 28 27  ALT 26 24  ALKPHOS 61 47  BILITOT 0.9 0.6  PROT 7.2 7.0  ALBUMIN 3.0* 2.6*   No results for input(s): LIPASE, AMYLASE in the last 168 hours. No results for input(s): AMMONIA in the last 168 hours. Coagulation Profile: No results for input(s): INR, PROTIME in the last 168 hours. Cardiac Enzymes: No results for input(s): CKTOTAL, CKMB, CKMBINDEX, TROPONINI in the last 168 hours. BNP (last 3 results) No results for input(s): PROBNP in the last 8760 hours. HbA1C: No results for input(s): HGBA1C in the last 72 hours. CBG: Recent  Labs  Lab 06/05/18 0756 06/05/18 1219 06/05/18 1721 06/05/18 2112 06/06/18 0734  GLUCAP 344* 234* 212* 226* 139*   Lipid Profile: No results for input(s): CHOL, HDL, LDLCALC, TRIG, CHOLHDL, LDLDIRECT in the last 72 hours. Thyroid Function Tests: No results for input(s): TSH, T4TOTAL, FREET4, T3FREE, THYROIDAB in the last 72 hours. Anemia Panel: No results for input(s): VITAMINB12, FOLATE, FERRITIN, TIBC, IRON, RETICCTPCT in the last 72 hours. Sepsis Labs: Recent Labs  Lab 06/05/18 0146 06/05/18 0441 06/05/18 0502 06/05/18 0748  LATICACIDVEN 2.16* 1.54 1.3 1.8    No results found for this or any previous visit (from the past 240 hour(s)).   Radiology Studies: Dg Chest 2 View  Result Date: 06/05/2018 CLINICAL DATA:  Sepsis EXAM: CHEST - 2 VIEW COMPARISON:  03/01/2012 FINDINGS: No consolidation or effusion. Borderline cardiomegaly with vascular congestion and hazy bibasilar atelectasis or edema. No pneumothorax. IMPRESSION: Borderline to mild cardiomegaly with vascular congestion and hazy bibasilar atelectasis versus minimal edema. Electronically Signed   By: Jasmine PangKim  Fujinaga M.D.   On: 06/05/2018 02:15   Scheduled Meds: . amLODipine  5 mg Oral Daily  . heparin  5,000 Units Subcutaneous Q8H  . insulin aspart  0-5 Units Subcutaneous QHS  . insulin aspart  0-9 Units Subcutaneous TID WC  . insulin glargine  12 Units Subcutaneous QHS  . sodium chloride flush  3 mL Intravenous Q12H  . sodium chloride flush  3 mL Intravenous Q12H   Continuous Infusions: . sodium chloride 250 mL (06/05/18 0554)  . sodium chloride 75 mL/hr at 06/06/18 0208  . cefTRIAXone (ROCEPHIN)  IV 2 g (06/06/18 0620)  . magnesium sulfate 1 - 4 g bolus IVPB      LOS: 1 day   Merlene Laughtermair Latif Latacha Texeira, DO [facility] Pager [phone]  If 7PM-7AM, please contact night-coverage [email or web address] Password [email or web address] 06/06/2018, 7:45 AM

## 2018-06-07 DIAGNOSIS — N179 Acute kidney failure, unspecified: Secondary | ICD-10-CM

## 2018-06-07 LAB — COMPREHENSIVE METABOLIC PANEL
ALK PHOS: 46 U/L (ref 38–126)
ALT: 20 U/L (ref 0–44)
ANION GAP: 7 (ref 5–15)
AST: 21 U/L (ref 15–41)
Albumin: 2.6 g/dL — ABNORMAL LOW (ref 3.5–5.0)
BILIRUBIN TOTAL: 0.7 mg/dL (ref 0.3–1.2)
BUN: 16 mg/dL (ref 8–23)
CALCIUM: 8.6 mg/dL — AB (ref 8.9–10.3)
CO2: 22 mmol/L (ref 22–32)
Chloride: 111 mmol/L (ref 98–111)
Creatinine, Ser: 0.92 mg/dL (ref 0.44–1.00)
Glucose, Bld: 136 mg/dL — ABNORMAL HIGH (ref 70–99)
Potassium: 3.5 mmol/L (ref 3.5–5.1)
SODIUM: 140 mmol/L (ref 135–145)
Total Protein: 6.5 g/dL (ref 6.5–8.1)

## 2018-06-07 LAB — CBC WITH DIFFERENTIAL/PLATELET
Abs Immature Granulocytes: 0 10*3/uL (ref 0.0–0.1)
BASOS ABS: 0 10*3/uL (ref 0.0–0.1)
Basophils Relative: 0 %
EOS PCT: 1 %
Eosinophils Absolute: 0.1 10*3/uL (ref 0.0–0.7)
HEMATOCRIT: 29.7 % — AB (ref 36.0–46.0)
HEMOGLOBIN: 9.9 g/dL — AB (ref 12.0–15.0)
Immature Granulocytes: 0 %
LYMPHS PCT: 16 %
Lymphs Abs: 1.5 10*3/uL (ref 0.7–4.0)
MCH: 26.1 pg (ref 26.0–34.0)
MCHC: 33.3 g/dL (ref 30.0–36.0)
MCV: 78.4 fL (ref 78.0–100.0)
MONO ABS: 0.9 10*3/uL (ref 0.1–1.0)
MONOS PCT: 9 %
Neutro Abs: 6.8 10*3/uL (ref 1.7–7.7)
Neutrophils Relative %: 74 %
Platelets: 222 10*3/uL (ref 150–400)
RBC: 3.79 MIL/uL — ABNORMAL LOW (ref 3.87–5.11)
RDW: 16.2 % — ABNORMAL HIGH (ref 11.5–15.5)
WBC: 9.4 10*3/uL (ref 4.0–10.5)

## 2018-06-07 LAB — FOLATE: Folate: 6.6 ng/mL (ref >5.9)

## 2018-06-07 LAB — IRON AND TIBC
Iron: 85 ug/dL (ref 28–170)
Saturation Ratios: 35 % — ABNORMAL HIGH (ref 10.4–31.8)
TIBC: 245 ug/dL — ABNORMAL LOW (ref 250–450)
UIBC: 160 ug/dL

## 2018-06-07 LAB — URINE CULTURE: Culture: >=100000 — AB

## 2018-06-07 LAB — RETICULOCYTES
RBC.: 3.89 MIL/uL (ref 3.87–5.11)
RETIC COUNT ABSOLUTE: 23.3 10*3/uL (ref 19.0–186.0)
RETIC CT PCT: 0.6 % (ref 0.4–3.1)

## 2018-06-07 LAB — GLUCOSE, CAPILLARY
GLUCOSE-CAPILLARY: 140 mg/dL — AB (ref 70–99)
Glucose-Capillary: 138 mg/dL — ABNORMAL HIGH (ref 70–99)

## 2018-06-07 LAB — MAGNESIUM: MAGNESIUM: 1.9 mg/dL (ref 1.7–2.4)

## 2018-06-07 LAB — VITAMIN B12: Vitamin B-12: 682 pg/mL (ref 180–914)

## 2018-06-07 LAB — PHOSPHORUS: PHOSPHORUS: 3 mg/dL (ref 2.5–4.6)

## 2018-06-07 LAB — FERRITIN: FERRITIN: 317 ng/mL — AB (ref 11–307)

## 2018-06-07 MED ORDER — CEPHALEXIN 500 MG PO CAPS: 500.0000 mg | ORAL_CAPSULE | Freq: Two times a day (BID) | ORAL | 0 refills | Status: AC

## 2018-06-07 MED ORDER — METFORMIN HCL 500 MG PO TABS: 500.0000 mg | ORAL_TABLET | Freq: Two times a day (BID) | ORAL | 2 refills | Status: AC

## 2018-06-07 MED ORDER — LOSARTAN POTASSIUM-HCTZ 50-12.5 MG PO TABS: 1.0000 | ORAL_TABLET | Freq: Every day | ORAL | 2 refills | Status: DC

## 2018-06-07 MED ORDER — CEPHALEXIN 500 MG PO CAPS: 500.0000 mg | ORAL_CAPSULE | Freq: Two times a day (BID) | ORAL | Status: DC

## 2018-06-07 MED ORDER — CIPROFLOXACIN HCL 500 MG PO TABS: 500.0000 mg | ORAL_TABLET | Freq: Two times a day (BID) | ORAL | Status: DC

## 2018-06-07 NOTE — Discharge Summary (Signed)
Physician Discharge Summary  Laurel DimmerJuanita Netzer ZOX:[id number] DOB: 05-08-1951 DOA: 06/05/2018  PCP: Corine ShelterKilpatrick, George, MD  Admit date: 06/05/2018 Discharge date: 06/07/2018  Admitted From: Home Disposition: Home  Recommendations for Outpatient Follow-up:  1. Follow up with PCP in 1-2 weeks; Patient has an appointment scheduled for tomorrow 06/08/18 with PCP  2. Please obtain CMP/CBC, Mag, Phos in one week 3. Please follow up on the following pending results:  Home Health: No Equipment/Devices: None  Discharge Condition: Stable  CODE STATUS: FULL CODE Diet recommendation: Heart Healthy/Carb Modified Diet   Brief/Interim Summary: The patient is a 67 year old female with past medical history significant for insulin-dependent diabetes mellitus, hypertension, and other comorbidities who presented to the emergency room for evaluation of dysuria,malaise,reported confusion. She was admitted to the hospital for the treatment of sepsis secondary to urinary tract infection. Urinalysis done admission showed a cloudy appearance with greater than 500 glucose, large hemoglobin of the urine dipstick, large leukocytes, negative nitrites, few bacteria, greater than 50 RBCs per high-power field, and greater than 50 WBCs. Urine culture was sent and grew >100,000 CFU of E Coli.She was given IV Levofloxacin in the emergency room and we will changed this to IV Ceftriaxone currently while hospitalized and will change to po Keflex at D/C.Patient's lactic acidosis has resolved initially but she still had an AKI continued IV fluid hydration at a rate of 75 mL'sper hour with normal saline. AKI has now resolved.  Patient steadily improved and was deemed medically stable to be discharged at this time will need to follow-up with primary care physician in the AM.  Discharge Diagnoses:  Principal Problem:   Sepsis secondary to UTI [facility](HCC) Active Problems:   Hypertension   Insulin-requiring or dependent type II diabetes  mellitus (HCC)   Renal insufficiency  Sepsis secondary to UTI, improved  -Presented with dysuria, malaise, and mild confusion reported by family -Found to be tachycardic with leukocytosis, elevated lactate, and UA consistent with infection -Sepsis physiology is improved -Blood cultures collected in ED and empiric abx started; Blood Cx x2 Showed NGTD at 2 days  -Urinalysis on admission showed cloudy appearance, greater than 500 glucose, large hemoglobin, large leukocytes, negative nitrites, few bacteria, greater than 50 WBCs -Urine culture showed >100,000 CFU of E Coli that was only resistant to Ampicillin or Ampicillin/Sulbactam.  -Lactic acid level improved from 2.16 now 1.8 -WBC improved from 17.2 is now 9.4 -Continued with empiric IV Ceftriaxone and narrowed to po Keflex at D/C for total of 7 day course -Started IV fluid hydration with normal saline at rate of 75 mL per hour we will continue until D/C.   Insulin-dependent DM -No recent A1c on file, was 13.0% in 2013 -Repeat HbA1c as an outpatient  -Managed at home with Lantus 18 units qHS and metformin -Continue Lantus with 12 units qHS and start sensitive NovoLog sliding scale insulin before meals and at bedtime while hospitalized  -CBGs ranging from 138-228  Hypertension -BP at goal and was 117/77 this morning -Continued Amlodipine 5 mg p.o. Daily while hospitalized given AKI -Held Losartan-HCTZ initially given elevated creatinine with unknown baseline,but can resume at D/C  AKI -SCr is 1.53 on admission; was normal on most recent prior chem panel from 2013; BUN/Cr trended down and is now 16/0.92 -Treated with 500 cc NS by EMS pta; now on IV fluid hydration with normal saline rate of 75 mL per hour -BUN/creatinine has now improved and went from 22/1.03 -> 16/0.92 -Checking urine chemistries -Renally-dose medications and avoid Nephrotoxic Medications  if possible,  -Resume Home Losartan-HCTZ at D/C -Change to monitor  repeat CMP as an outpatient   Hypomagnesemia, improved -Patient's Mag Level was 1.3 and improved to 1.9 -Replete with Mag Sulfate 3 grams yesterday  -Continue to Monitor and Replete as Necessary -Repeat Mag Level in AM   Microcytic Anemia -Patient's hemoglobin/hematocrit 11.0/32.6 on admission is now 9.9/29.7 -Checked Anemia Panel and showed 85, U IBC of 160, TIBC of 245, saturation ratio 35%, Ferritin level 317, folate level 6.6, and vitamin B12 level of 682 -Likely Dilutional Dop -Continue to monitor for signs and symptoms of bleeding -Repeat CBC as an outpatient and have PCP continue workup  -Will need a screening Colonoscopy if not already had one.   Discharge Instructions  Discharge Instructions    Call MD for:  difficulty breathing, headache or visual disturbances   Complete by:  As directed    Call MD for:  extreme fatigue   Complete by:  As directed    Call MD for:  hives   Complete by:  As directed    Call MD for:  persistant dizziness or light-headedness   Complete by:  As directed    Call MD for:  persistant nausea and vomiting   Complete by:  As directed    Call MD for:  redness, tenderness, or signs of infection (pain, swelling, redness, odor or green/yellow discharge around incision site)   Complete by:  As directed    Call MD for:  severe uncontrolled pain   Complete by:  As directed    Call MD for:  temperature >100.4   Complete by:  As directed    Diet - low sodium heart healthy   Complete by:  As directed    Diet Carb Modified   Complete by:  As directed    Discharge instructions   Complete by:  As directed    Follow up with PCP within 1 week of discharge. Take all medications as prescribed. If symptoms change or worsen please return to the ED for evaluation.   Increase activity slowly   Complete by:  As directed      Allergies as of 06/07/2018   No Known Allergies     Medication List    STOP taking these medications   amLODipine 5 MG  tablet Commonly known as:  NORVASC     TAKE these medications   cephALEXin 500 MG capsule Commonly known as:  KEFLEX Take 1 capsule (500 mg total) by mouth every 12 (twelve) hours for 4 days. Start taking on:  06/08/2018   insulin glargine 100 UNIT/ML injection Commonly known as:  LANTUS Inject 18 Units into the skin at bedtime. What changed:    how much to take  when to take this  reasons to take this   losartan-hydrochlorothiazide 50-12.5 MG tablet Commonly known as:  HYZAAR Take 1 tablet by mouth daily.   metFORMIN 500 MG tablet Commonly known as:  GLUCOPHAGE Take 1 tablet (500 mg total) by mouth 2 (two) times daily with a meal.      Follow-up Information    Corine Shelter, MD. Call.   Specialty:  Pulmonary Disease Why:  Follow up within 1 week; Appointment for tomorrow  Contact information: [address] [place] [address] [phone]          No Known Allergies  Consultations:  None  Procedures/Studies: Dg Chest 2 View  Result Date: 06/05/2018 CLINICAL DATA:  Sepsis EXAM: CHEST - 2 VIEW COMPARISON:  03/01/2012  FINDINGS: No consolidation or effusion. Borderline cardiomegaly with vascular congestion and hazy bibasilar atelectasis or edema. No pneumothorax. IMPRESSION: Borderline to mild cardiomegaly with vascular congestion and hazy bibasilar atelectasis versus minimal edema. Electronically Signed   By: Jasmine Pang M.D.   On: 06/05/2018 02:15    Subjective: Seen and examined at bedside patient improved.  Denies dysuria, shortness breath, lightheadedness or dizziness.  No other complaints or concerns at this time is excited to go home.  Discharge Exam: Vitals:   06/07/18 0411 06/07/18 0739  BP: 129/82 135/77  Pulse: 66 64  Resp: 16 18  Temp: 98.4 F (36.9 C) 98.2 F (36.8 C)  SpO2: 98% 97%   Vitals:   06/06/18 1655 06/06/18 2030 06/07/18 0411 06/07/18 0739  BP: 124/76 138/84 129/82 135/77  Pulse: 73 68 66 64  Resp: 16 16 16 18    Temp: 97.9 F (36.6 C) 98.7 F (37.1 C) 98.4 F (36.9 C) 98.2 F (36.8 C)  TempSrc: Oral Oral Oral Oral  SpO2: 98% 98% 98% 97%  Weight:  93.1 kg (205 lb 3.2 oz)    Height:       General: Pt is alert, awake, not in acute distress Cardiovascular: RRR, S1/S2 +, no rubs, no gallops Respiratory: Diminished bilaterally, no wheezing, no rhonchi Abdominal: Soft, NT, Distended 2/2 body habitus, bowel sounds + Extremities: Trace LE edema, no cyanosis  The results of significant diagnostics from this hospitalization (including imaging, microbiology, ancillary and laboratory) are listed below for reference.    Microbiology: Recent Results (from the past 240 hour(s))  Urine Culture     Status: Abnormal   Collection Time: 06/05/18  3:06 AM  Result Value Ref Range Status   Specimen Description URINE, RANDOM  Final   Special Requests   Final    NONE Performed at [facility], [address], [place], [place] [address]    Culture >=100,000 COLONIES/mL ESCHERICHIA COLI (A)  Final   Report Status 06/07/2018 FINAL  Final   Organism ID, Bacteria ESCHERICHIA COLI (A)  Final      Susceptibility   Escherichia coli - MIC*    AMPICILLIN >=32 RESISTANT Resistant     CEFAZOLIN <=4 SENSITIVE Sensitive     CEFTRIAXONE <=1 SENSITIVE Sensitive     CIPROFLOXACIN <=0.25 SENSITIVE Sensitive     GENTAMICIN <=1 SENSITIVE Sensitive     IMIPENEM <=0.25 SENSITIVE Sensitive     NITROFURANTOIN <=16 SENSITIVE Sensitive     TRIMETH/SULFA <=20 SENSITIVE Sensitive     AMPICILLIN/SULBACTAM 16 INTERMEDIATE Intermediate     PIP/TAZO <=4 SENSITIVE Sensitive     Extended ESBL NEGATIVE Sensitive     * >=100,000 COLONIES/mL ESCHERICHIA COLI  Culture, blood (Routine x 2)     Status: None (Preliminary result)   Collection Time: 06/05/18  3:07 AM  Result Value Ref Range Status   Specimen Description BLOOD RIGHT HAND  Final   Special Requests   Final    BOTTLES DRAWN AEROBIC AND ANAEROBIC Blood Culture adequate  volume   Culture   Final    NO GROWTH 2 DAYS Performed at [facility], [address], [place], [place] [address]    Report Status PENDING  Incomplete  Culture, blood (Routine x 2)     Status: None (Preliminary result)   Collection Time: 06/05/18  3:08 AM  Result Value Ref Range Status   Specimen Description BLOOD LEFT HAND  Final   Special Requests   Final    BOTTLES DRAWN  AEROBIC AND ANAEROBIC Blood Culture adequate volume   Culture   Final    NO GROWTH 2 DAYS Performed at [facility], [address] [place], [place] [address]    Report Status PENDING  Incomplete    Labs: BNP (last 3 results) No results for input(s): BNP in the last 8760 hours. Basic Metabolic Panel: Recent Labs  Lab 06/05/18 0141 06/06/18 0510 06/07/18 0540  NA 135 140 140  K 3.7 3.8 3.5  CL 102 108 111  CO2 23 23 22   GLUCOSE 352* 124* 136*  BUN 32* 22 16  CREATININE 1.53* 1.03* 0.92  CALCIUM 8.4* 8.4* 8.6*  MG  --  1.3* 1.9  PHOS  --  2.7 3.0   Liver Function Tests: Recent Labs  Lab 06/05/18 0141 06/06/18 0510 06/07/18 0540  AST 28 27 21   ALT 26 24 20   ALKPHOS 61 47 46  BILITOT 0.9 0.6 0.7  PROT 7.2 7.0 6.5  ALBUMIN 3.0* 2.6* 2.6*   No results for input(s): LIPASE, AMYLASE in the last 168 hours. No results for input(s): AMMONIA in the last 168 hours. CBC: Recent Labs  Lab 06/05/18 0141 06/06/18 0510 06/07/18 0540  WBC 17.2* 11.8* 9.4  NEUTROABS 15.2* 8.0* 6.8  HGB 11.0* 10.2* 9.9*  HCT 32.6* 30.1* 29.7*  MCV 78.6 78.2 78.4  PLT 218 214 222   Cardiac Enzymes: No results for input(s): CKTOTAL, CKMB, CKMBINDEX, TROPONINI in the last 168 hours. BNP: Invalid input(s): POCBNP CBG: Recent Labs  Lab 06/06/18 1220 06/06/18 1651 06/06/18 2026 06/07/18 0736 06/07/18 1149  GLUCAP 215* 228* 215* 140* 138*   D-Dimer No results for input(s): DDIMER in the last 72 hours. Hgb A1c No results for input(s): HGBA1C in the last 72 hours. Lipid Profile No results for  input(s): CHOL, HDL, LDLCALC, TRIG, CHOLHDL, LDLDIRECT in the last 72 hours. Thyroid function studies No results for input(s): TSH, T4TOTAL, T3FREE, THYROIDAB in the last 72 hours.  Invalid input(s): FREET3 Anemia work up Recent Labs    06/07/18 1002  VITAMINB12 682  FOLATE 6.6  FERRITIN 317*  TIBC 245*  IRON 85  RETICCTPCT 0.6   Urinalysis    Component Value Date/Time   COLORURINE YELLOW 06/05/2018 0316   APPEARANCEUR CLOUDY (A) 06/05/2018 0316   LABSPEC 1.011 06/05/2018 0316   PHURINE 5.0 06/05/2018 0316   GLUCOSEU >=500 (A) 06/05/2018 0316   HGBUR LARGE (A) 06/05/2018 0316   BILIRUBINUR NEGATIVE 06/05/2018 0316   KETONESUR NEGATIVE 06/05/2018 0316   PROTEINUR 100 (A) 06/05/2018 0316   UROBILINOGEN 0.2 03/01/2012 0018   NITRITE NEGATIVE 06/05/2018 0316   LEUKOCYTESUR LARGE (A) 06/05/2018 0316   Sepsis Labs Invalid input(s): PROCALCITONIN,  WBC,  LACTICIDVEN Microbiology Recent Results (from the past 240 hour(s))  Urine Culture     Status: Abnormal   Collection Time: 06/05/18  3:06 AM  Result Value Ref Range Status   Specimen Description URINE, RANDOM  Final   Special Requests   Final    NONE Performed at [facility], [address], [place], [place] [address]    Culture >=100,000 COLONIES/mL ESCHERICHIA COLI (A)  Final   Report Status 06/07/2018 FINAL  Final   Organism ID, Bacteria ESCHERICHIA COLI (A)  Final      Susceptibility   Escherichia coli - MIC*    AMPICILLIN >=32 RESISTANT Resistant     CEFAZOLIN <=4 SENSITIVE Sensitive     CEFTRIAXONE <=1 SENSITIVE Sensitive     CIPROFLOXACIN <=0.25 SENSITIVE  Sensitive     GENTAMICIN <=1 SENSITIVE Sensitive     IMIPENEM <=0.25 SENSITIVE Sensitive     NITROFURANTOIN <=16 SENSITIVE Sensitive     TRIMETH/SULFA <=20 SENSITIVE Sensitive     AMPICILLIN/SULBACTAM 16 INTERMEDIATE Intermediate     PIP/TAZO <=4 SENSITIVE Sensitive     Extended ESBL NEGATIVE Sensitive     * >=100,000 COLONIES/mL ESCHERICHIA COLI   Culture, blood (Routine x 2)     Status: None (Preliminary result)   Collection Time: 06/05/18  3:07 AM  Result Value Ref Range Status   Specimen Description BLOOD RIGHT HAND  Final   Special Requests   Final    BOTTLES DRAWN AEROBIC AND ANAEROBIC Blood Culture adequate volume   Culture   Final    NO GROWTH 2 DAYS Performed at [facility], [address] [place], [place] [address]    Report Status PENDING  Incomplete  Culture, blood (Routine x 2)     Status: None (Preliminary result)   Collection Time: 06/05/18  3:08 AM  Result Value Ref Range Status   Specimen Description BLOOD LEFT HAND  Final   Special Requests   Final    BOTTLES DRAWN AEROBIC AND ANAEROBIC Blood Culture adequate volume   Culture   Final    NO GROWTH 2 DAYS Performed at [facility], [address] [place], [place] [address]    Report Status PENDING  Incomplete   Time coordinating discharge: 35 minutes  SIGNED:  Merlene Laughter, DO [facility] 06/07/2018, 1:14 PM Pager [phone]  If 7PM-7AM, please contact night-coverage [email or web address] Password [email or web address]

## 2018-06-08 DIAGNOSIS — Z6836 Body mass index (BMI) 36.0-36.9, adult: Secondary | ICD-10-CM | POA: Diagnosis not present

## 2018-06-08 DIAGNOSIS — I119 Hypertensive heart disease without heart failure: Secondary | ICD-10-CM | POA: Diagnosis not present

## 2018-06-08 DIAGNOSIS — E1165 Type 2 diabetes mellitus with hyperglycemia: Secondary | ICD-10-CM | POA: Diagnosis not present

## 2018-06-08 DIAGNOSIS — N10 Acute tubulo-interstitial nephritis: Secondary | ICD-10-CM | POA: Diagnosis not present

## 2018-06-08 DIAGNOSIS — E7849 Other hyperlipidemia: Secondary | ICD-10-CM | POA: Diagnosis not present

## 2018-06-08 DIAGNOSIS — E669 Obesity, unspecified: Secondary | ICD-10-CM | POA: Diagnosis not present

## 2018-06-08 DIAGNOSIS — Z8249 Family history of ischemic heart disease and other diseases of the circulatory system: Secondary | ICD-10-CM | POA: Diagnosis not present

## 2018-06-08 DIAGNOSIS — Z79899 Other long term (current) drug therapy: Secondary | ICD-10-CM | POA: Diagnosis not present

## 2018-06-10 LAB — CULTURE, BLOOD (ROUTINE X 2)
CULTURE: NO GROWTH
CULTURE: NO GROWTH
SPECIAL REQUESTS: ADEQUATE
Special Requests: ADEQUATE

## 2018-07-16 DIAGNOSIS — Z803 Family history of malignant neoplasm of breast: Secondary | ICD-10-CM | POA: Diagnosis not present

## 2018-07-16 DIAGNOSIS — Z1231 Encounter for screening mammogram for malignant neoplasm of breast: Secondary | ICD-10-CM | POA: Diagnosis not present

## 2018-12-03 DIAGNOSIS — E7849 Other hyperlipidemia: Secondary | ICD-10-CM | POA: Diagnosis not present

## 2018-12-03 DIAGNOSIS — I119 Hypertensive heart disease without heart failure: Secondary | ICD-10-CM | POA: Diagnosis not present

## 2018-12-03 DIAGNOSIS — Z6836 Body mass index (BMI) 36.0-36.9, adult: Secondary | ICD-10-CM | POA: Diagnosis not present

## 2018-12-03 DIAGNOSIS — Z8249 Family history of ischemic heart disease and other diseases of the circulatory system: Secondary | ICD-10-CM | POA: Diagnosis not present

## 2018-12-03 DIAGNOSIS — E785 Hyperlipidemia, unspecified: Secondary | ICD-10-CM | POA: Diagnosis not present

## 2018-12-03 DIAGNOSIS — Z79899 Other long term (current) drug therapy: Secondary | ICD-10-CM | POA: Diagnosis not present

## 2018-12-03 DIAGNOSIS — E1165 Type 2 diabetes mellitus with hyperglycemia: Secondary | ICD-10-CM | POA: Diagnosis not present

## 2018-12-03 DIAGNOSIS — E669 Obesity, unspecified: Secondary | ICD-10-CM | POA: Diagnosis not present

## 2019-07-15 DIAGNOSIS — I119 Hypertensive heart disease without heart failure: Secondary | ICD-10-CM | POA: Diagnosis not present

## 2019-07-15 DIAGNOSIS — E785 Hyperlipidemia, unspecified: Secondary | ICD-10-CM | POA: Diagnosis not present

## 2019-07-15 DIAGNOSIS — Z139 Encounter for screening, unspecified: Secondary | ICD-10-CM | POA: Diagnosis not present

## 2019-07-15 DIAGNOSIS — Z8249 Family history of ischemic heart disease and other diseases of the circulatory system: Secondary | ICD-10-CM | POA: Diagnosis not present

## 2019-07-15 DIAGNOSIS — Z6836 Body mass index (BMI) 36.0-36.9, adult: Secondary | ICD-10-CM | POA: Diagnosis not present

## 2019-07-15 DIAGNOSIS — E1165 Type 2 diabetes mellitus with hyperglycemia: Secondary | ICD-10-CM | POA: Diagnosis not present

## 2019-07-15 DIAGNOSIS — E7849 Other hyperlipidemia: Secondary | ICD-10-CM | POA: Diagnosis not present

## 2019-07-15 DIAGNOSIS — E669 Obesity, unspecified: Secondary | ICD-10-CM | POA: Diagnosis not present

## 2019-07-15 DIAGNOSIS — Z79899 Other long term (current) drug therapy: Secondary | ICD-10-CM | POA: Diagnosis not present

## 2019-10-21 DIAGNOSIS — Z79899 Other long term (current) drug therapy: Secondary | ICD-10-CM | POA: Diagnosis not present

## 2019-10-21 DIAGNOSIS — E669 Obesity, unspecified: Secondary | ICD-10-CM | POA: Diagnosis not present

## 2019-10-21 DIAGNOSIS — E785 Hyperlipidemia, unspecified: Secondary | ICD-10-CM | POA: Diagnosis not present

## 2019-10-21 DIAGNOSIS — Z8249 Family history of ischemic heart disease and other diseases of the circulatory system: Secondary | ICD-10-CM | POA: Diagnosis not present

## 2019-10-21 DIAGNOSIS — Z23 Encounter for immunization: Secondary | ICD-10-CM | POA: Diagnosis not present

## 2019-10-21 DIAGNOSIS — E1165 Type 2 diabetes mellitus with hyperglycemia: Secondary | ICD-10-CM | POA: Diagnosis not present

## 2019-10-21 DIAGNOSIS — Z6838 Body mass index (BMI) 38.0-38.9, adult: Secondary | ICD-10-CM | POA: Diagnosis not present

## 2019-10-21 DIAGNOSIS — I119 Hypertensive heart disease without heart failure: Secondary | ICD-10-CM | POA: Diagnosis not present

## 2019-11-06 IMAGING — CR DG CHEST 2V
2 series · 2 of 2 positions shown · non-contrast
Comparison: 03/01/2012

CLINICAL DATA: Sepsis

EXAM:
CHEST - 2 VIEW

[chest lat]
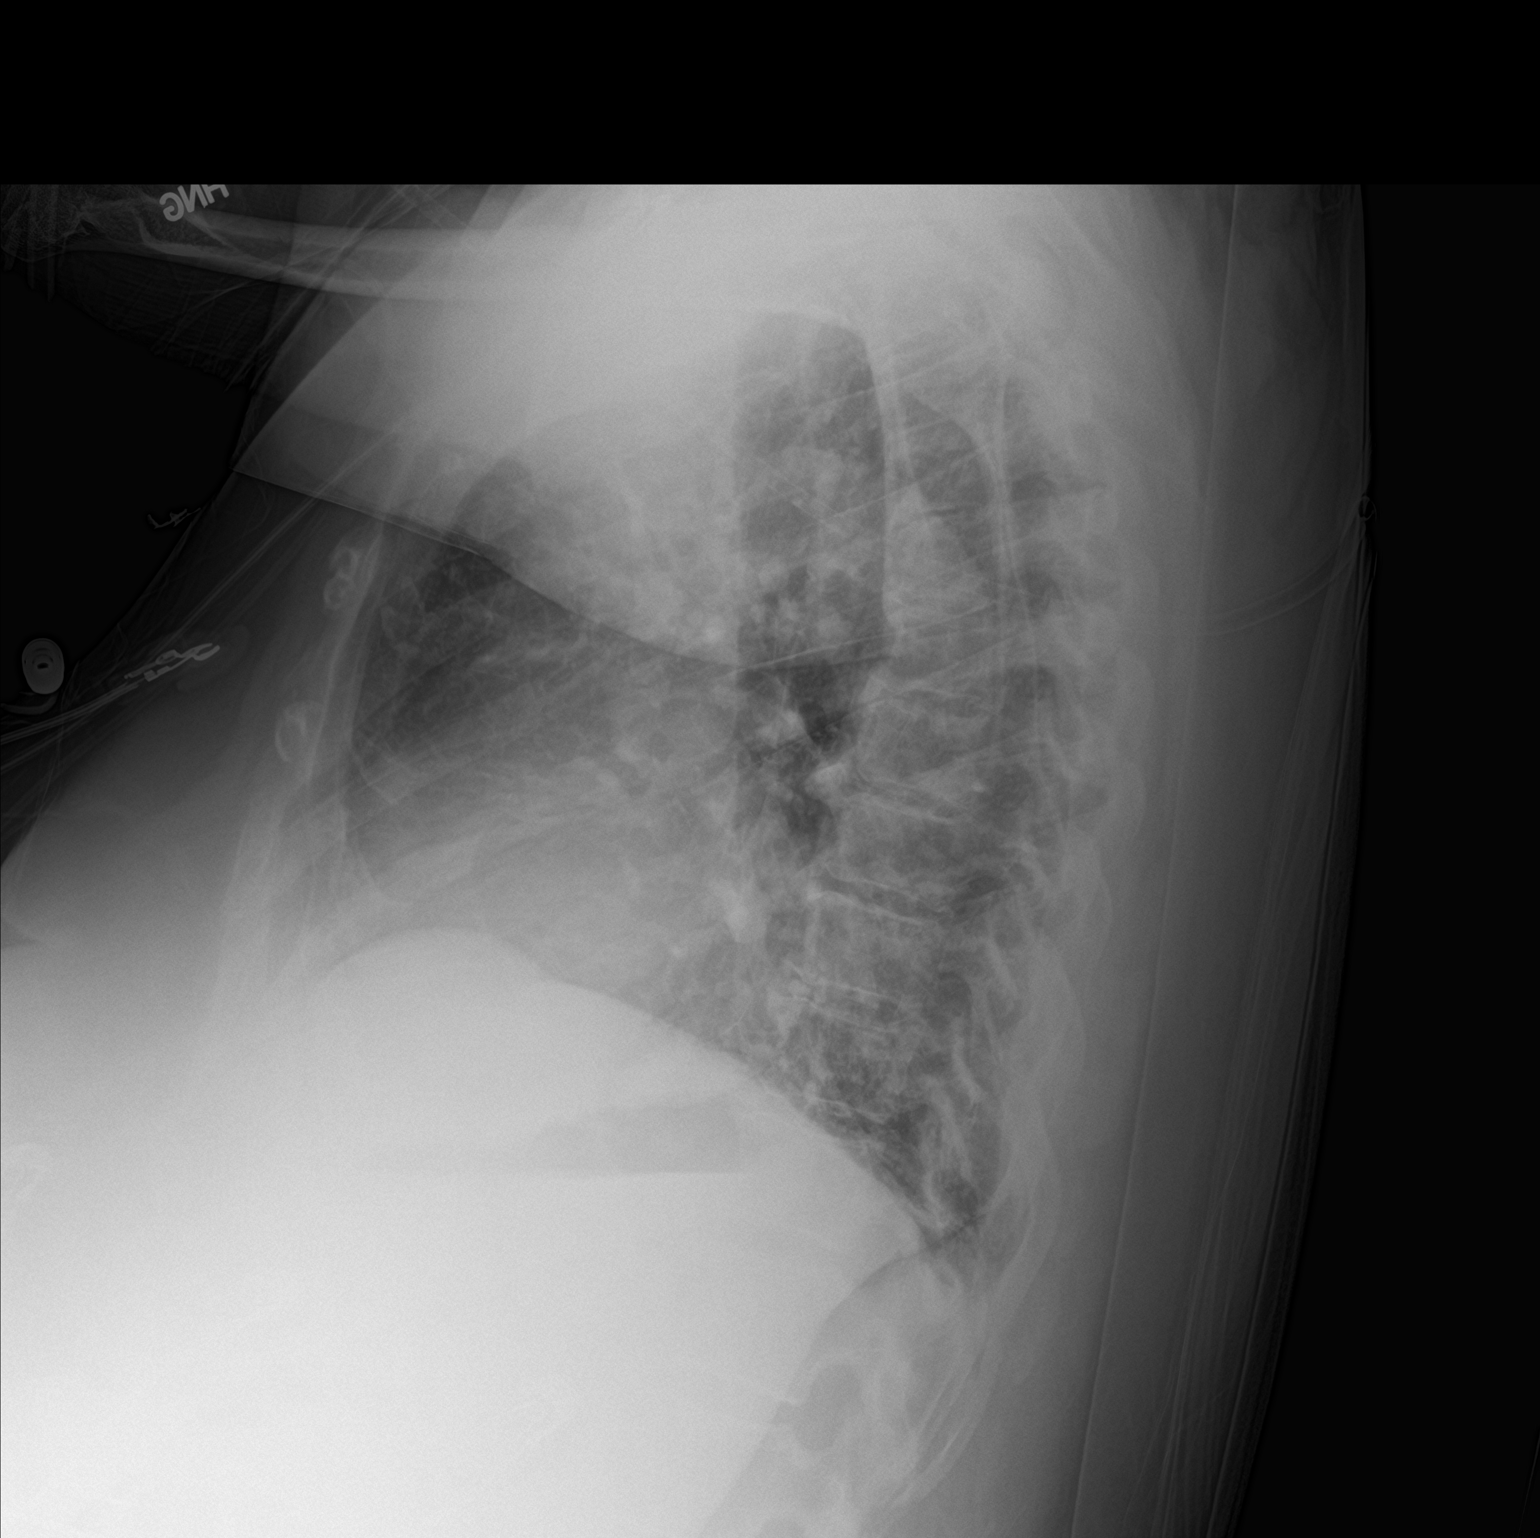

[chest ap]
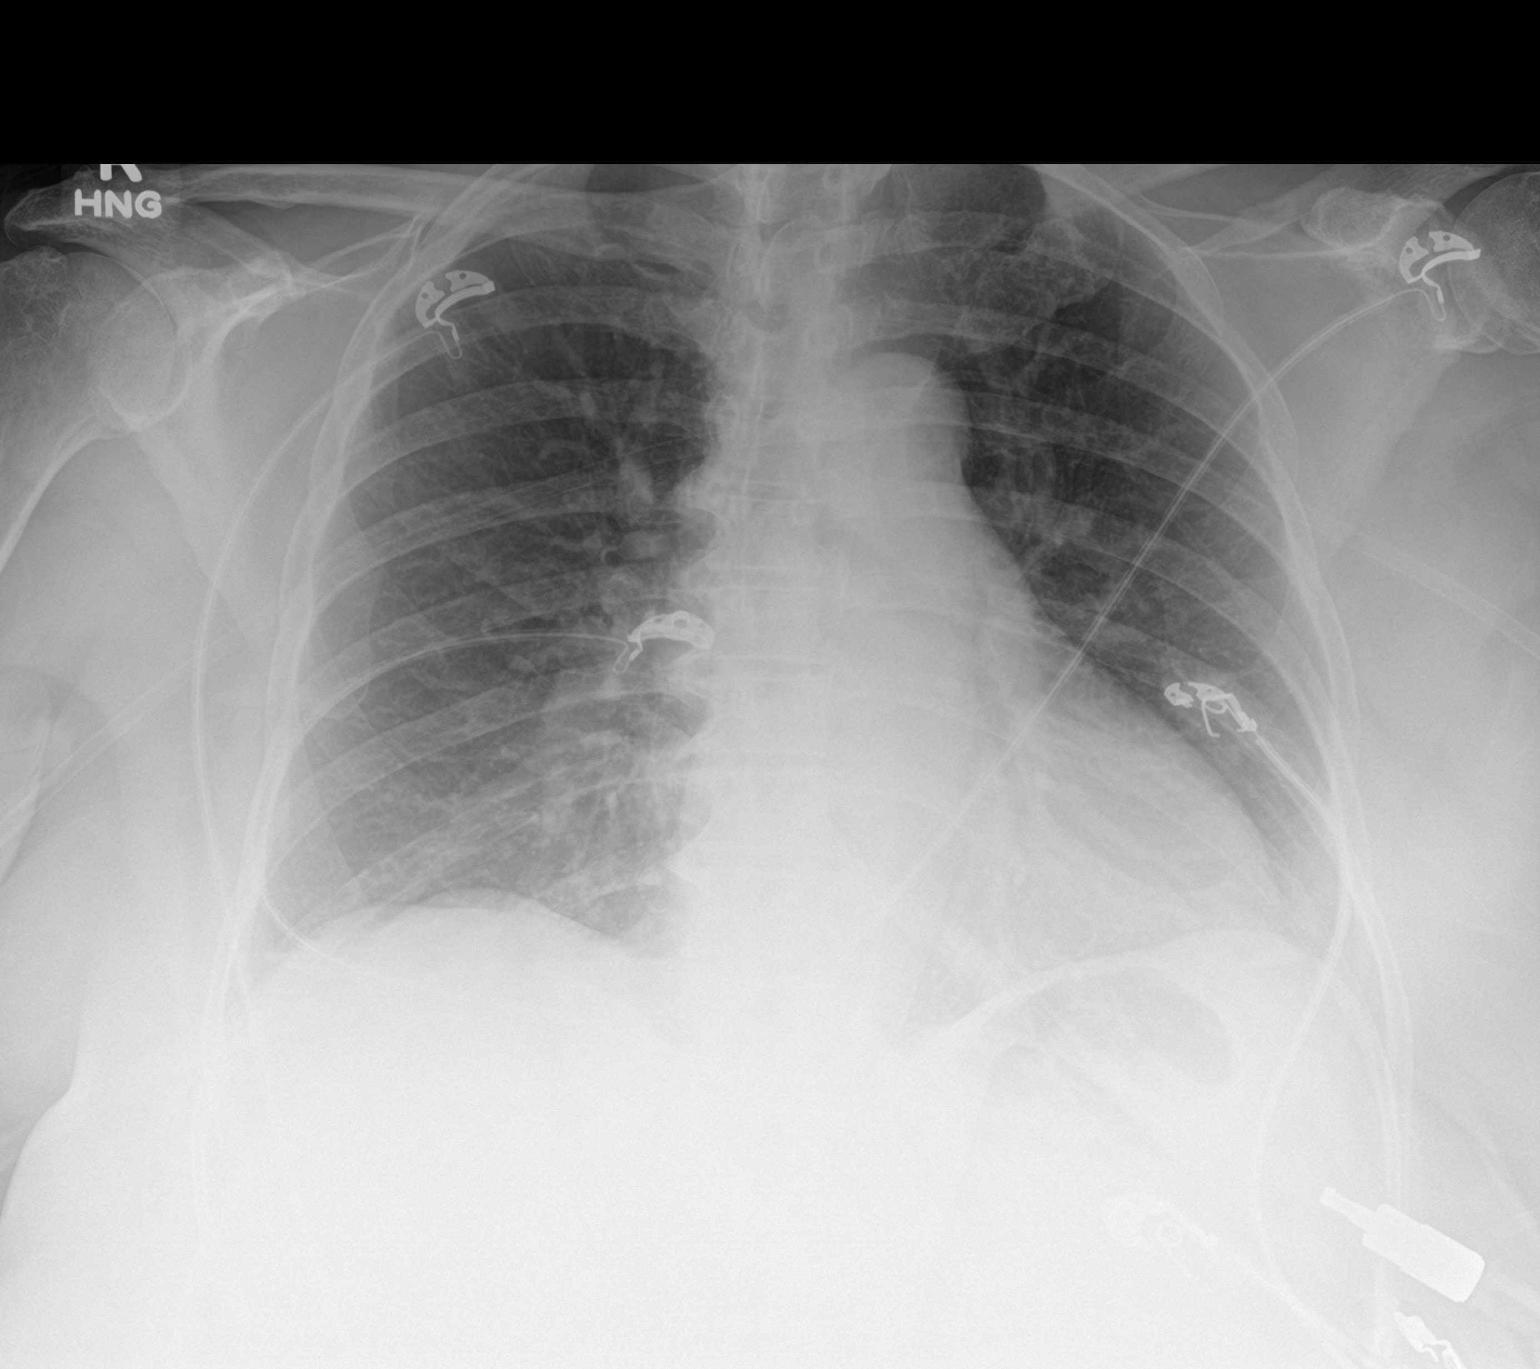

[2 of 2 positions shown; findings below may reference images not displayed]

FINDINGS: No consolidation or effusion. Borderline cardiomegaly with vascular
congestion and hazy bibasilar atelectasis or edema. No pneumothorax.
IMPRESSION: Borderline to mild cardiomegaly with vascular congestion and hazy
bibasilar atelectasis versus minimal edema.

## 2020-01-17 DIAGNOSIS — Z1231 Encounter for screening mammogram for malignant neoplasm of breast: Secondary | ICD-10-CM | POA: Diagnosis not present

## 2020-02-05 ENCOUNTER — Ambulatory Visit: Payer: Medicare HMO | Attending: Internal Medicine

## 2020-02-05 DIAGNOSIS — Z23 Encounter for immunization: Secondary | ICD-10-CM | POA: Insufficient documentation

## 2020-02-05 NOTE — Progress Notes (Signed)
   Covid-19 Vaccination Clinic  Name:  Kayla Snyder    MRN: [id number] DOB: 1951/03/19  02/05/2020  Kayla Snyder was observed post Covid-19 immunization for 15 minutes without incident. She was provided with Vaccine Information Sheet and instruction to access the V-Safe system.   Kayla Snyder was instructed to call 911 with any severe reactions post vaccine: Marland Kitchen Difficulty breathing  . Swelling of face and throat  . A fast heartbeat  . A bad rash all over body  . Dizziness and weakness   Immunizations Administered    Name Date Dose VIS Date Route   Pfizer COVID-19 Vaccine 02/05/2020 10:09 AM 0.3 mL 11/12/2019 Intramuscular   Manufacturer: [employer], [employer]   Lot: [id number]   NDC: [address]-4

## 2020-02-26 ENCOUNTER — Ambulatory Visit: Payer: Medicare HMO | Attending: Internal Medicine

## 2020-02-26 DIAGNOSIS — Z23 Encounter for immunization: Secondary | ICD-10-CM

## 2020-02-26 NOTE — Progress Notes (Signed)
   Covid-19 Vaccination Clinic  Name:  Chinenye Katzenberger    MRN: [id number] DOB: 1951/10/14  02/26/2020  Ms. Hodges was observed post Covid-19 immunization for 15 minutes without incident. She was provided with Vaccine Information Sheet and instruction to access the V-Safe system.   Ms. Scearce was instructed to call 911 with any severe reactions post vaccine: Marland Kitchen Difficulty breathing  . Swelling of face and throat  . A fast heartbeat  . A bad rash all over body  . Dizziness and weakness   Immunizations Administered    Name Date Dose VIS Date Route   Pfizer COVID-19 Vaccine 02/26/2020 10:02 AM 0.3 mL 11/12/2019 Intramuscular   Manufacturer: [employer], [employer]   Lot: [id number]   NDC: [address]-3

## 2020-03-23 DIAGNOSIS — Z79899 Other long term (current) drug therapy: Secondary | ICD-10-CM | POA: Diagnosis not present

## 2020-03-23 DIAGNOSIS — E669 Obesity, unspecified: Secondary | ICD-10-CM | POA: Diagnosis not present

## 2020-03-23 DIAGNOSIS — Z1159 Encounter for screening for other viral diseases: Secondary | ICD-10-CM | POA: Diagnosis not present

## 2020-03-23 DIAGNOSIS — Z6836 Body mass index (BMI) 36.0-36.9, adult: Secondary | ICD-10-CM | POA: Diagnosis not present

## 2020-03-23 DIAGNOSIS — Z8249 Family history of ischemic heart disease and other diseases of the circulatory system: Secondary | ICD-10-CM | POA: Diagnosis not present

## 2020-03-23 DIAGNOSIS — E785 Hyperlipidemia, unspecified: Secondary | ICD-10-CM | POA: Diagnosis not present

## 2020-03-23 DIAGNOSIS — E1165 Type 2 diabetes mellitus with hyperglycemia: Secondary | ICD-10-CM | POA: Diagnosis not present

## 2020-03-23 DIAGNOSIS — Z0001 Encounter for general adult medical examination with abnormal findings: Secondary | ICD-10-CM | POA: Diagnosis not present

## 2020-03-23 DIAGNOSIS — N182 Chronic kidney disease, stage 2 (mild): Secondary | ICD-10-CM | POA: Diagnosis not present

## 2020-03-23 DIAGNOSIS — I119 Hypertensive heart disease without heart failure: Secondary | ICD-10-CM | POA: Diagnosis not present

## 2020-03-23 DIAGNOSIS — E7849 Other hyperlipidemia: Secondary | ICD-10-CM | POA: Diagnosis not present

## 2020-07-03 DIAGNOSIS — E119 Type 2 diabetes mellitus without complications: Secondary | ICD-10-CM | POA: Diagnosis not present

## 2020-07-03 DIAGNOSIS — Z794 Long term (current) use of insulin: Secondary | ICD-10-CM | POA: Diagnosis not present

## 2020-07-03 DIAGNOSIS — Z8249 Family history of ischemic heart disease and other diseases of the circulatory system: Secondary | ICD-10-CM | POA: Diagnosis not present

## 2020-07-03 DIAGNOSIS — I10 Essential (primary) hypertension: Secondary | ICD-10-CM | POA: Diagnosis not present

## 2020-07-03 DIAGNOSIS — E785 Hyperlipidemia, unspecified: Secondary | ICD-10-CM | POA: Diagnosis not present

## 2020-07-03 DIAGNOSIS — Z008 Encounter for other general examination: Secondary | ICD-10-CM | POA: Diagnosis not present

## 2020-07-03 DIAGNOSIS — Z7984 Long term (current) use of oral hypoglycemic drugs: Secondary | ICD-10-CM | POA: Diagnosis not present

## 2020-07-03 DIAGNOSIS — Z803 Family history of malignant neoplasm of breast: Secondary | ICD-10-CM | POA: Diagnosis not present

## 2020-07-03 DIAGNOSIS — Z6838 Body mass index (BMI) 38.0-38.9, adult: Secondary | ICD-10-CM | POA: Diagnosis not present

## 2020-07-24 DIAGNOSIS — Z8249 Family history of ischemic heart disease and other diseases of the circulatory system: Secondary | ICD-10-CM | POA: Diagnosis not present

## 2020-07-24 DIAGNOSIS — I119 Hypertensive heart disease without heart failure: Secondary | ICD-10-CM | POA: Diagnosis not present

## 2020-07-24 DIAGNOSIS — N182 Chronic kidney disease, stage 2 (mild): Secondary | ICD-10-CM | POA: Diagnosis not present

## 2020-07-24 DIAGNOSIS — E7849 Other hyperlipidemia: Secondary | ICD-10-CM | POA: Diagnosis not present

## 2020-07-24 DIAGNOSIS — Z6836 Body mass index (BMI) 36.0-36.9, adult: Secondary | ICD-10-CM | POA: Diagnosis not present

## 2020-07-24 DIAGNOSIS — Z79899 Other long term (current) drug therapy: Secondary | ICD-10-CM | POA: Diagnosis not present

## 2020-07-24 DIAGNOSIS — E669 Obesity, unspecified: Secondary | ICD-10-CM | POA: Diagnosis not present

## 2020-07-24 DIAGNOSIS — E1165 Type 2 diabetes mellitus with hyperglycemia: Secondary | ICD-10-CM | POA: Diagnosis not present

## 2020-10-23 DIAGNOSIS — E7849 Other hyperlipidemia: Secondary | ICD-10-CM | POA: Diagnosis not present

## 2020-10-23 DIAGNOSIS — E669 Obesity, unspecified: Secondary | ICD-10-CM | POA: Diagnosis not present

## 2020-10-23 DIAGNOSIS — E785 Hyperlipidemia, unspecified: Secondary | ICD-10-CM | POA: Diagnosis not present

## 2020-10-23 DIAGNOSIS — Z79899 Other long term (current) drug therapy: Secondary | ICD-10-CM | POA: Diagnosis not present

## 2020-10-23 DIAGNOSIS — I119 Hypertensive heart disease without heart failure: Secondary | ICD-10-CM | POA: Diagnosis not present

## 2020-10-23 DIAGNOSIS — Z23 Encounter for immunization: Secondary | ICD-10-CM | POA: Diagnosis not present

## 2020-10-23 DIAGNOSIS — E1165 Type 2 diabetes mellitus with hyperglycemia: Secondary | ICD-10-CM | POA: Diagnosis not present

## 2020-10-23 DIAGNOSIS — Z6838 Body mass index (BMI) 38.0-38.9, adult: Secondary | ICD-10-CM | POA: Diagnosis not present

## 2020-10-23 DIAGNOSIS — Z8249 Family history of ischemic heart disease and other diseases of the circulatory system: Secondary | ICD-10-CM | POA: Diagnosis not present

## 2020-10-23 DIAGNOSIS — N182 Chronic kidney disease, stage 2 (mild): Secondary | ICD-10-CM | POA: Diagnosis not present

## 2020-10-26 DIAGNOSIS — M109 Gout, unspecified: Secondary | ICD-10-CM | POA: Diagnosis not present

## 2021-01-02 DIAGNOSIS — E669 Obesity, unspecified: Secondary | ICD-10-CM | POA: Diagnosis not present

## 2021-01-02 DIAGNOSIS — E785 Hyperlipidemia, unspecified: Secondary | ICD-10-CM | POA: Diagnosis not present

## 2021-01-02 DIAGNOSIS — Z79899 Other long term (current) drug therapy: Secondary | ICD-10-CM | POA: Diagnosis not present

## 2021-01-02 DIAGNOSIS — M2550 Pain in unspecified joint: Secondary | ICD-10-CM | POA: Diagnosis not present

## 2021-01-02 DIAGNOSIS — E1165 Type 2 diabetes mellitus with hyperglycemia: Secondary | ICD-10-CM | POA: Diagnosis not present

## 2021-01-02 DIAGNOSIS — I119 Hypertensive heart disease without heart failure: Secondary | ICD-10-CM | POA: Diagnosis not present

## 2021-01-02 DIAGNOSIS — M1000 Idiopathic gout, unspecified site: Secondary | ICD-10-CM | POA: Diagnosis not present

## 2021-01-02 DIAGNOSIS — Z6838 Body mass index (BMI) 38.0-38.9, adult: Secondary | ICD-10-CM | POA: Diagnosis not present

## 2021-01-04 DIAGNOSIS — M2550 Pain in unspecified joint: Secondary | ICD-10-CM | POA: Diagnosis not present

## 2021-01-04 DIAGNOSIS — I119 Hypertensive heart disease without heart failure: Secondary | ICD-10-CM | POA: Diagnosis not present

## 2021-01-20 DIAGNOSIS — Z1231 Encounter for screening mammogram for malignant neoplasm of breast: Secondary | ICD-10-CM | POA: Diagnosis not present

## 2021-02-07 DIAGNOSIS — I10 Essential (primary) hypertension: Secondary | ICD-10-CM | POA: Diagnosis not present

## 2021-02-07 DIAGNOSIS — E1165 Type 2 diabetes mellitus with hyperglycemia: Secondary | ICD-10-CM | POA: Diagnosis not present

## 2021-02-07 DIAGNOSIS — Z803 Family history of malignant neoplasm of breast: Secondary | ICD-10-CM | POA: Diagnosis not present

## 2021-02-07 DIAGNOSIS — M1990 Unspecified osteoarthritis, unspecified site: Secondary | ICD-10-CM | POA: Diagnosis not present

## 2021-02-07 DIAGNOSIS — Z7984 Long term (current) use of oral hypoglycemic drugs: Secondary | ICD-10-CM | POA: Diagnosis not present

## 2021-02-07 DIAGNOSIS — E785 Hyperlipidemia, unspecified: Secondary | ICD-10-CM | POA: Diagnosis not present

## 2021-02-07 DIAGNOSIS — Z794 Long term (current) use of insulin: Secondary | ICD-10-CM | POA: Diagnosis not present

## 2021-02-07 DIAGNOSIS — Z8249 Family history of ischemic heart disease and other diseases of the circulatory system: Secondary | ICD-10-CM | POA: Diagnosis not present

## 2021-02-07 DIAGNOSIS — M109 Gout, unspecified: Secondary | ICD-10-CM | POA: Diagnosis not present

## 2021-04-16 DIAGNOSIS — M1000 Idiopathic gout, unspecified site: Secondary | ICD-10-CM | POA: Diagnosis not present

## 2021-04-16 DIAGNOSIS — Z79899 Other long term (current) drug therapy: Secondary | ICD-10-CM | POA: Diagnosis not present

## 2021-04-16 DIAGNOSIS — E7849 Other hyperlipidemia: Secondary | ICD-10-CM | POA: Diagnosis not present

## 2021-04-16 DIAGNOSIS — M79671 Pain in right foot: Secondary | ICD-10-CM | POA: Diagnosis not present

## 2021-04-16 DIAGNOSIS — E1165 Type 2 diabetes mellitus with hyperglycemia: Secondary | ICD-10-CM | POA: Diagnosis not present

## 2021-04-16 DIAGNOSIS — I119 Hypertensive heart disease without heart failure: Secondary | ICD-10-CM | POA: Diagnosis not present

## 2021-04-16 DIAGNOSIS — E669 Obesity, unspecified: Secondary | ICD-10-CM | POA: Diagnosis not present

## 2021-04-16 DIAGNOSIS — Z8249 Family history of ischemic heart disease and other diseases of the circulatory system: Secondary | ICD-10-CM | POA: Diagnosis not present

## 2021-08-27 DIAGNOSIS — E1165 Type 2 diabetes mellitus with hyperglycemia: Secondary | ICD-10-CM | POA: Diagnosis not present

## 2021-08-27 DIAGNOSIS — E669 Obesity, unspecified: Secondary | ICD-10-CM | POA: Diagnosis not present

## 2021-08-27 DIAGNOSIS — Z79899 Other long term (current) drug therapy: Secondary | ICD-10-CM | POA: Diagnosis not present

## 2021-08-27 DIAGNOSIS — I119 Hypertensive heart disease without heart failure: Secondary | ICD-10-CM | POA: Diagnosis not present

## 2021-08-27 DIAGNOSIS — K219 Gastro-esophageal reflux disease without esophagitis: Secondary | ICD-10-CM | POA: Diagnosis not present

## 2021-08-27 DIAGNOSIS — E785 Hyperlipidemia, unspecified: Secondary | ICD-10-CM | POA: Diagnosis not present

## 2021-08-27 DIAGNOSIS — M1000 Idiopathic gout, unspecified site: Secondary | ICD-10-CM | POA: Diagnosis not present

## 2021-08-27 DIAGNOSIS — Z6838 Body mass index (BMI) 38.0-38.9, adult: Secondary | ICD-10-CM | POA: Diagnosis not present

## 2021-12-18 DIAGNOSIS — I119 Hypertensive heart disease without heart failure: Secondary | ICD-10-CM | POA: Diagnosis not present

## 2021-12-18 DIAGNOSIS — E785 Hyperlipidemia, unspecified: Secondary | ICD-10-CM | POA: Diagnosis not present

## 2021-12-18 DIAGNOSIS — E1165 Type 2 diabetes mellitus with hyperglycemia: Secondary | ICD-10-CM | POA: Diagnosis not present

## 2021-12-18 DIAGNOSIS — M109 Gout, unspecified: Secondary | ICD-10-CM | POA: Diagnosis not present

## 2021-12-18 DIAGNOSIS — Z6832 Body mass index (BMI) 32.0-32.9, adult: Secondary | ICD-10-CM | POA: Diagnosis not present

## 2021-12-18 DIAGNOSIS — Z0001 Encounter for general adult medical examination with abnormal findings: Secondary | ICD-10-CM | POA: Diagnosis not present

## 2021-12-18 DIAGNOSIS — Z8249 Family history of ischemic heart disease and other diseases of the circulatory system: Secondary | ICD-10-CM | POA: Diagnosis not present

## 2021-12-18 DIAGNOSIS — Z79899 Other long term (current) drug therapy: Secondary | ICD-10-CM | POA: Diagnosis not present

## 2021-12-18 DIAGNOSIS — E669 Obesity, unspecified: Secondary | ICD-10-CM | POA: Diagnosis not present

## 2022-01-05 DIAGNOSIS — E113211 Type 2 diabetes mellitus with mild nonproliferative diabetic retinopathy with macular edema, right eye: Secondary | ICD-10-CM | POA: Diagnosis not present

## 2022-01-28 DIAGNOSIS — H43823 Vitreomacular adhesion, bilateral: Secondary | ICD-10-CM | POA: Diagnosis not present

## 2022-01-28 DIAGNOSIS — E113292 Type 2 diabetes mellitus with mild nonproliferative diabetic retinopathy without macular edema, left eye: Secondary | ICD-10-CM | POA: Diagnosis not present

## 2022-01-28 DIAGNOSIS — E113211 Type 2 diabetes mellitus with mild nonproliferative diabetic retinopathy with macular edema, right eye: Secondary | ICD-10-CM | POA: Diagnosis not present

## 2022-01-28 DIAGNOSIS — H35033 Hypertensive retinopathy, bilateral: Secondary | ICD-10-CM | POA: Diagnosis not present

## 2022-02-11 DIAGNOSIS — E113211 Type 2 diabetes mellitus with mild nonproliferative diabetic retinopathy with macular edema, right eye: Secondary | ICD-10-CM | POA: Diagnosis not present

## 2022-02-28 DIAGNOSIS — M109 Gout, unspecified: Secondary | ICD-10-CM | POA: Diagnosis not present

## 2022-02-28 DIAGNOSIS — Z79899 Other long term (current) drug therapy: Secondary | ICD-10-CM | POA: Diagnosis not present

## 2022-02-28 DIAGNOSIS — Z6832 Body mass index (BMI) 32.0-32.9, adult: Secondary | ICD-10-CM | POA: Diagnosis not present

## 2022-02-28 DIAGNOSIS — L209 Atopic dermatitis, unspecified: Secondary | ICD-10-CM | POA: Diagnosis not present

## 2022-02-28 DIAGNOSIS — L299 Pruritus, unspecified: Secondary | ICD-10-CM | POA: Diagnosis not present

## 2022-02-28 DIAGNOSIS — Z8249 Family history of ischemic heart disease and other diseases of the circulatory system: Secondary | ICD-10-CM | POA: Diagnosis not present

## 2022-02-28 DIAGNOSIS — E785 Hyperlipidemia, unspecified: Secondary | ICD-10-CM | POA: Diagnosis not present

## 2022-02-28 DIAGNOSIS — E1165 Type 2 diabetes mellitus with hyperglycemia: Secondary | ICD-10-CM | POA: Diagnosis not present

## 2022-02-28 DIAGNOSIS — E669 Obesity, unspecified: Secondary | ICD-10-CM | POA: Diagnosis not present

## 2022-02-28 DIAGNOSIS — I119 Hypertensive heart disease without heart failure: Secondary | ICD-10-CM | POA: Diagnosis not present

## 2022-03-01 DIAGNOSIS — Z79899 Other long term (current) drug therapy: Secondary | ICD-10-CM | POA: Diagnosis not present

## 2022-03-01 DIAGNOSIS — L209 Atopic dermatitis, unspecified: Secondary | ICD-10-CM | POA: Diagnosis not present

## 2022-03-01 DIAGNOSIS — E785 Hyperlipidemia, unspecified: Secondary | ICD-10-CM | POA: Diagnosis not present

## 2022-03-01 DIAGNOSIS — E1165 Type 2 diabetes mellitus with hyperglycemia: Secondary | ICD-10-CM | POA: Diagnosis not present

## 2022-03-01 DIAGNOSIS — M1000 Idiopathic gout, unspecified site: Secondary | ICD-10-CM | POA: Diagnosis not present

## 2022-03-16 DIAGNOSIS — Z1231 Encounter for screening mammogram for malignant neoplasm of breast: Secondary | ICD-10-CM | POA: Diagnosis not present

## 2022-05-09 DIAGNOSIS — H43823 Vitreomacular adhesion, bilateral: Secondary | ICD-10-CM | POA: Diagnosis not present

## 2022-05-09 DIAGNOSIS — E113211 Type 2 diabetes mellitus with mild nonproliferative diabetic retinopathy with macular edema, right eye: Secondary | ICD-10-CM | POA: Diagnosis not present

## 2022-05-09 DIAGNOSIS — H35033 Hypertensive retinopathy, bilateral: Secondary | ICD-10-CM | POA: Diagnosis not present

## 2022-05-09 DIAGNOSIS — E113292 Type 2 diabetes mellitus with mild nonproliferative diabetic retinopathy without macular edema, left eye: Secondary | ICD-10-CM | POA: Diagnosis not present

## 2022-05-13 DIAGNOSIS — Z8249 Family history of ischemic heart disease and other diseases of the circulatory system: Secondary | ICD-10-CM | POA: Diagnosis not present

## 2022-05-13 DIAGNOSIS — E785 Hyperlipidemia, unspecified: Secondary | ICD-10-CM | POA: Diagnosis not present

## 2022-05-13 DIAGNOSIS — L209 Atopic dermatitis, unspecified: Secondary | ICD-10-CM | POA: Diagnosis not present

## 2022-05-13 DIAGNOSIS — Z6833 Body mass index (BMI) 33.0-33.9, adult: Secondary | ICD-10-CM | POA: Diagnosis not present

## 2022-05-13 DIAGNOSIS — I119 Hypertensive heart disease without heart failure: Secondary | ICD-10-CM | POA: Diagnosis not present

## 2022-05-13 DIAGNOSIS — L299 Pruritus, unspecified: Secondary | ICD-10-CM | POA: Diagnosis not present

## 2022-05-13 DIAGNOSIS — M109 Gout, unspecified: Secondary | ICD-10-CM | POA: Diagnosis not present

## 2022-05-13 DIAGNOSIS — E1165 Type 2 diabetes mellitus with hyperglycemia: Secondary | ICD-10-CM | POA: Diagnosis not present

## 2022-05-13 DIAGNOSIS — Z1211 Encounter for screening for malignant neoplasm of colon: Secondary | ICD-10-CM | POA: Diagnosis not present

## 2022-05-13 DIAGNOSIS — E669 Obesity, unspecified: Secondary | ICD-10-CM | POA: Diagnosis not present

## 2022-05-13 DIAGNOSIS — Z79899 Other long term (current) drug therapy: Secondary | ICD-10-CM | POA: Diagnosis not present

## 2022-05-13 DIAGNOSIS — D638 Anemia in other chronic diseases classified elsewhere: Secondary | ICD-10-CM | POA: Diagnosis not present

## 2022-07-04 DIAGNOSIS — I10 Essential (primary) hypertension: Secondary | ICD-10-CM | POA: Diagnosis not present

## 2022-07-04 DIAGNOSIS — Z8249 Family history of ischemic heart disease and other diseases of the circulatory system: Secondary | ICD-10-CM | POA: Diagnosis not present

## 2022-07-04 DIAGNOSIS — Z794 Long term (current) use of insulin: Secondary | ICD-10-CM | POA: Diagnosis not present

## 2022-07-04 DIAGNOSIS — Z833 Family history of diabetes mellitus: Secondary | ICD-10-CM | POA: Diagnosis not present

## 2022-07-04 DIAGNOSIS — E119 Type 2 diabetes mellitus without complications: Secondary | ICD-10-CM | POA: Diagnosis not present

## 2022-07-04 DIAGNOSIS — Z7984 Long term (current) use of oral hypoglycemic drugs: Secondary | ICD-10-CM | POA: Diagnosis not present

## 2022-07-04 DIAGNOSIS — Z803 Family history of malignant neoplasm of breast: Secondary | ICD-10-CM | POA: Diagnosis not present

## 2022-07-04 DIAGNOSIS — M109 Gout, unspecified: Secondary | ICD-10-CM | POA: Diagnosis not present

## 2022-07-04 DIAGNOSIS — E785 Hyperlipidemia, unspecified: Secondary | ICD-10-CM | POA: Diagnosis not present

## 2022-07-04 DIAGNOSIS — Z6835 Body mass index (BMI) 35.0-35.9, adult: Secondary | ICD-10-CM | POA: Diagnosis not present

## 2022-07-04 DIAGNOSIS — L299 Pruritus, unspecified: Secondary | ICD-10-CM | POA: Diagnosis not present

## 2022-08-22 DIAGNOSIS — Z23 Encounter for immunization: Secondary | ICD-10-CM | POA: Diagnosis not present

## 2022-08-22 DIAGNOSIS — Z8249 Family history of ischemic heart disease and other diseases of the circulatory system: Secondary | ICD-10-CM | POA: Diagnosis not present

## 2022-08-22 DIAGNOSIS — Z79899 Other long term (current) drug therapy: Secondary | ICD-10-CM | POA: Diagnosis not present

## 2022-08-22 DIAGNOSIS — L299 Pruritus, unspecified: Secondary | ICD-10-CM | POA: Diagnosis not present

## 2022-08-22 DIAGNOSIS — Z6832 Body mass index (BMI) 32.0-32.9, adult: Secondary | ICD-10-CM | POA: Diagnosis not present

## 2022-08-22 DIAGNOSIS — M109 Gout, unspecified: Secondary | ICD-10-CM | POA: Diagnosis not present

## 2022-08-22 DIAGNOSIS — E1165 Type 2 diabetes mellitus with hyperglycemia: Secondary | ICD-10-CM | POA: Diagnosis not present

## 2022-08-22 DIAGNOSIS — B354 Tinea corporis: Secondary | ICD-10-CM | POA: Diagnosis not present

## 2022-08-22 DIAGNOSIS — E669 Obesity, unspecified: Secondary | ICD-10-CM | POA: Diagnosis not present

## 2022-08-22 DIAGNOSIS — E785 Hyperlipidemia, unspecified: Secondary | ICD-10-CM | POA: Diagnosis not present

## 2022-08-22 DIAGNOSIS — I119 Hypertensive heart disease without heart failure: Secondary | ICD-10-CM | POA: Diagnosis not present

## 2022-09-19 DIAGNOSIS — H43823 Vitreomacular adhesion, bilateral: Secondary | ICD-10-CM | POA: Diagnosis not present

## 2022-09-19 DIAGNOSIS — E113211 Type 2 diabetes mellitus with mild nonproliferative diabetic retinopathy with macular edema, right eye: Secondary | ICD-10-CM | POA: Diagnosis not present

## 2022-09-19 DIAGNOSIS — H35033 Hypertensive retinopathy, bilateral: Secondary | ICD-10-CM | POA: Diagnosis not present

## 2022-09-19 DIAGNOSIS — E113292 Type 2 diabetes mellitus with mild nonproliferative diabetic retinopathy without macular edema, left eye: Secondary | ICD-10-CM | POA: Diagnosis not present

## 2022-10-10 DIAGNOSIS — Z8249 Family history of ischemic heart disease and other diseases of the circulatory system: Secondary | ICD-10-CM | POA: Diagnosis not present

## 2022-10-10 DIAGNOSIS — Z79899 Other long term (current) drug therapy: Secondary | ICD-10-CM | POA: Diagnosis not present

## 2022-10-10 DIAGNOSIS — E669 Obesity, unspecified: Secondary | ICD-10-CM | POA: Diagnosis not present

## 2022-10-10 DIAGNOSIS — I119 Hypertensive heart disease without heart failure: Secondary | ICD-10-CM | POA: Diagnosis not present

## 2022-10-10 DIAGNOSIS — Z6832 Body mass index (BMI) 32.0-32.9, adult: Secondary | ICD-10-CM | POA: Diagnosis not present

## 2022-10-10 DIAGNOSIS — Z23 Encounter for immunization: Secondary | ICD-10-CM | POA: Diagnosis not present

## 2022-10-10 DIAGNOSIS — M1000 Idiopathic gout, unspecified site: Secondary | ICD-10-CM | POA: Diagnosis not present

## 2022-10-10 DIAGNOSIS — B354 Tinea corporis: Secondary | ICD-10-CM | POA: Diagnosis not present

## 2022-10-10 DIAGNOSIS — E1165 Type 2 diabetes mellitus with hyperglycemia: Secondary | ICD-10-CM | POA: Diagnosis not present

## 2022-10-10 DIAGNOSIS — L299 Pruritus, unspecified: Secondary | ICD-10-CM | POA: Diagnosis not present

## 2022-10-10 DIAGNOSIS — E785 Hyperlipidemia, unspecified: Secondary | ICD-10-CM | POA: Diagnosis not present

## 2022-10-10 DIAGNOSIS — M109 Gout, unspecified: Secondary | ICD-10-CM | POA: Diagnosis not present

## 2023-01-14 DIAGNOSIS — E669 Obesity, unspecified: Secondary | ICD-10-CM | POA: Diagnosis not present

## 2023-01-14 DIAGNOSIS — Z8249 Family history of ischemic heart disease and other diseases of the circulatory system: Secondary | ICD-10-CM | POA: Diagnosis not present

## 2023-01-14 DIAGNOSIS — M109 Gout, unspecified: Secondary | ICD-10-CM | POA: Diagnosis not present

## 2023-01-14 DIAGNOSIS — E1165 Type 2 diabetes mellitus with hyperglycemia: Secondary | ICD-10-CM | POA: Diagnosis not present

## 2023-01-14 DIAGNOSIS — Z6832 Body mass index (BMI) 32.0-32.9, adult: Secondary | ICD-10-CM | POA: Diagnosis not present

## 2023-01-14 DIAGNOSIS — I119 Hypertensive heart disease without heart failure: Secondary | ICD-10-CM | POA: Diagnosis not present

## 2023-01-14 DIAGNOSIS — E785 Hyperlipidemia, unspecified: Secondary | ICD-10-CM | POA: Diagnosis not present

## 2023-01-14 DIAGNOSIS — L299 Pruritus, unspecified: Secondary | ICD-10-CM | POA: Diagnosis not present

## 2023-01-14 DIAGNOSIS — M65331 Trigger finger, right middle finger: Secondary | ICD-10-CM | POA: Diagnosis not present

## 2023-01-14 DIAGNOSIS — Z79899 Other long term (current) drug therapy: Secondary | ICD-10-CM | POA: Diagnosis not present

## 2023-01-21 DIAGNOSIS — M65331 Trigger finger, right middle finger: Secondary | ICD-10-CM | POA: Diagnosis not present

## 2023-02-20 DIAGNOSIS — I119 Hypertensive heart disease without heart failure: Secondary | ICD-10-CM | POA: Diagnosis not present

## 2023-02-20 DIAGNOSIS — M109 Gout, unspecified: Secondary | ICD-10-CM | POA: Diagnosis not present

## 2023-02-20 DIAGNOSIS — L299 Pruritus, unspecified: Secondary | ICD-10-CM | POA: Diagnosis not present

## 2023-02-20 DIAGNOSIS — M65331 Trigger finger, right middle finger: Secondary | ICD-10-CM | POA: Diagnosis not present

## 2023-02-20 DIAGNOSIS — E669 Obesity, unspecified: Secondary | ICD-10-CM | POA: Diagnosis not present

## 2023-02-20 DIAGNOSIS — N1831 Chronic kidney disease, stage 3a: Secondary | ICD-10-CM | POA: Diagnosis not present

## 2023-02-20 DIAGNOSIS — Z6832 Body mass index (BMI) 32.0-32.9, adult: Secondary | ICD-10-CM | POA: Diagnosis not present

## 2023-02-20 DIAGNOSIS — E785 Hyperlipidemia, unspecified: Secondary | ICD-10-CM | POA: Diagnosis not present

## 2023-02-20 DIAGNOSIS — Z79899 Other long term (current) drug therapy: Secondary | ICD-10-CM | POA: Diagnosis not present

## 2023-02-20 DIAGNOSIS — E1165 Type 2 diabetes mellitus with hyperglycemia: Secondary | ICD-10-CM | POA: Diagnosis not present

## 2023-02-20 DIAGNOSIS — Z8249 Family history of ischemic heart disease and other diseases of the circulatory system: Secondary | ICD-10-CM | POA: Diagnosis not present

## 2023-04-21 DIAGNOSIS — E113292 Type 2 diabetes mellitus with mild nonproliferative diabetic retinopathy without macular edema, left eye: Secondary | ICD-10-CM | POA: Diagnosis not present

## 2023-04-21 DIAGNOSIS — H43813 Vitreous degeneration, bilateral: Secondary | ICD-10-CM | POA: Diagnosis not present

## 2023-04-21 DIAGNOSIS — H43823 Vitreomacular adhesion, bilateral: Secondary | ICD-10-CM | POA: Diagnosis not present

## 2023-04-21 DIAGNOSIS — H35033 Hypertensive retinopathy, bilateral: Secondary | ICD-10-CM | POA: Diagnosis not present

## 2023-04-21 DIAGNOSIS — E113211 Type 2 diabetes mellitus with mild nonproliferative diabetic retinopathy with macular edema, right eye: Secondary | ICD-10-CM | POA: Diagnosis not present

## 2023-05-29 ENCOUNTER — Encounter (HOSPITAL_COMMUNITY): Payer: Self-pay

## 2023-05-29 ENCOUNTER — Ambulatory Visit (HOSPITAL_COMMUNITY)
Admission: EM | Admit: 2023-05-29 | Discharge: 2023-05-29 | Disposition: A | Discharge status: Home / Self Care | Payer: Medicare HMO

## 2023-05-29 DIAGNOSIS — M461 Sacroiliitis, not elsewhere classified: Secondary | ICD-10-CM | POA: Diagnosis not present

## 2023-05-29 LAB — POCT URINALYSIS DIP (MANUAL ENTRY)
Bilirubin, UA: NEGATIVE
Blood, UA: NEGATIVE
Glucose, UA: NEGATIVE mg/dL
Ketones, POC UA: NEGATIVE mg/dL
Leukocytes, UA: NEGATIVE
Nitrite, UA: NEGATIVE
Protein Ur, POC: 100 mg/dL — AB
Spec Grav, UA: 1.015 (ref 1.010–1.025)
Urobilinogen, UA: 1 E.U./dL
pH, UA: 5.5 (ref 5.0–8.0)

## 2023-05-29 MED ORDER — PREDNISONE 20 MG PO TABS: 40.0000 mg | ORAL_TABLET | Freq: Every day | ORAL | 0 refills | Status: AC

## 2023-05-29 NOTE — ED Triage Notes (Signed)
Back pain onset Sunday and getting worse. Pain in the lo back. No falls or injuries. No urinary symptoms. No history of back problems.

## 2023-05-29 NOTE — ED Provider Notes (Signed)
[facility]    CSN: [id number] Arrival date & time: 05/29/23  1838      History   Chief Complaint Chief Complaint  Patient presents with   Back Pain    HPI Kayla Snyder is a 72 y.o. female.   Pleasant 72yo female presents today with c/o lower back pain. It is located only to her midline and L side sacrum/ SI joint. There is no radiation of pain. It started on Monday after falling asleep on her couch after church Sunday afternoon. Reports the pain increases with transitions, such as going from sit to stand. It causes a dull, throbbing pain. She denies change in bowel or bladder habits, no flank pain, no fever. Did have sepsis secondary to UTI in 2019. Pt denies hx of kidney stones. No saddle anesthesia, no radicular symptoms. No injury or trauma, no recent heavy lifting. Pt denies any prior hx of back issues. She has been taking 1000mg  of Tylenol in the morning and again at night with moderate but temporary improvement to her pain. Does have DM, sugars were 101 this morning.    Back Pain   Past Medical History:  Diagnosis Date   Diabetes mellitus    Hypertension     Patient Active Problem List   Diagnosis Date Noted   Sepsis secondary to UTI (HCC) 06/05/2018   Insulin-requiring or dependent type II diabetes mellitus (HCC) 06/05/2018   Renal insufficiency 06/05/2018   Leukocytosis 03/01/2012   Hypertension     Past Surgical History:  Procedure Laterality Date   APPENDECTOMY      OB History   No obstetric history on file.      Home Medications    Prior to Admission medications   Medication Sig Start Date End Date Taking? Authorizing Provider  allopurinol (ZYLOPRIM) 300 MG tablet Take 300 mg by mouth daily.   Yes [provider]  candesartan-hydrochlorothiazide (ATACAND HCT) 16-12.5 MG tablet Take 1 tablet by mouth daily.   Yes [provider]  Insulin Glargine (BASAGLAR KWIKPEN) 100 UNIT/ML Inject 20 Units into the skin at bedtime.  02/13/23  Yes [provider]  metFORMIN (GLUCOPHAGE) 500 MG tablet Take 1 tablet (500 mg total) by mouth 2 (two) times daily with a meal. 06/07/18 09/08/24 Yes Sheikh, Omair Latif, DO  predniSONE (DELTASONE) 20 MG tablet Take 2 tablets (40 mg total) by mouth daily with breakfast for 5 days. 05/29/23 06/03/23 Yes Kitt Minardi L, PA  simvastatin (ZOCOR) 10 MG tablet Take 10 mg by mouth at bedtime. 04/21/23  Yes [provider]    Family History History reviewed. No pertinent family history.  Social History Social History   Tobacco Use   Smoking status: Never   Smokeless tobacco: Never  Vaping Use   Vaping Use: Never used  Substance Use Topics   Alcohol use: No   Drug use: Never     Allergies   Patient has no known allergies.   Review of Systems Review of Systems  Musculoskeletal:  Positive for back pain.  As per HPI   Physical Exam Triage Vital Signs ED Triage Vitals  Enc Vitals Group     BP 05/29/23 1850 (!) 145/79     Pulse Rate 05/29/23 1850 79     Resp 05/29/23 1850 18     Temp 05/29/23 1850 98 F (36.7 C)     Temp src --      SpO2 05/29/23 1850 96 %     Weight 05/29/23 1850 190  lb (86.2 kg)     Height 05/29/23 1850 5\' 2"  (1.575 m)     Head Circumference --      Peak Flow --      Pain Score 05/29/23 1847 10     Pain Loc --      Pain Edu? --      Excl. in GC? --    No data found.  Updated Vital Signs BP (!) 145/79 (BP Location: Left Arm)   Pulse 79   Temp 98 F (36.7 C)   Resp 18   Ht 5\' 2"  (1.575 m)   Wt 190 lb (86.2 kg)   SpO2 96%   BMI 34.75 kg/m   Visual Acuity Right Eye Distance:   Left Eye Distance:   Bilateral Distance:    Right Eye Near:   Left Eye Near:    Bilateral Near:     Physical Exam Vitals and nursing note reviewed.  Constitutional:      General: She is not in acute distress.    Appearance: Normal appearance. She is obese. She is not ill-appearing, toxic-appearing or diaphoretic.  HENT:     Head:  Normocephalic and atraumatic.  Cardiovascular:     Rate and Rhythm: Normal rate.  Pulmonary:     Effort: Pulmonary effort is normal. No respiratory distress.  Abdominal:     General: There is no distension.     Tenderness: There is no right CVA tenderness or left CVA tenderness.  Musculoskeletal:        General: Tenderness (midline to superior sacrum and to SI joint on L) present. No swelling, deformity or signs of injury. Normal range of motion.     Cervical back: Normal range of motion. No rigidity. No pain with movement.     Thoracic back: Normal. No swelling, deformity, lacerations, spasms, tenderness or bony tenderness. Normal range of motion. No scoliosis.     Lumbar back: Bony tenderness (midline sacrum and L SI joint only) present. No swelling, edema, deformity, lacerations, spasms or tenderness. Normal range of motion. Negative right straight leg raise test and negative left straight leg raise test.       Back:     Right lower leg: No edema.     Left lower leg: No edema.  Lymphadenopathy:     Cervical: No cervical adenopathy.  Skin:    General: Skin is warm and dry.     Coloration: Skin is not jaundiced.     Findings: No bruising, erythema or rash.  Neurological:     General: No focal deficit present.     Mental Status: She is alert and oriented to person, place, and time.     Sensory: No sensory deficit.     Motor: No weakness.     Gait: Gait normal.      UC Treatments / Results  Labs (all labs ordered are listed, but only abnormal results are displayed) Labs Reviewed  POCT URINALYSIS DIP (MANUAL ENTRY) - Abnormal; Notable for the following components:      Result Value   Protein Ur, POC =100 (*)    All other components within normal limits    EKG   Radiology No results found.  Procedures Procedures (including critical care time)  Medications Ordered in UC Medications - No data to display  Initial Impression / Assessment and Plan / UC Course  I have  reviewed the triage vital signs and the nursing notes.  Pertinent labs & imaging results that were available during  my care of the patient were reviewed by me and considered in my medical decision making (see chart for details).     Sacroiliitis - UA obtained which is negative for infectious cause. Sx localized to SI joint with no red flag s/sx. Will do short course of prednisone. Pt is a diabetic but well managed and sugars have been around 100 per pt. Pt will f/u with PCP if sx persist, ER precautions reviewed.    Final Clinical Impressions(s) / UC Diagnoses   Final diagnoses:  Sacroiliitis [facility])     Discharge Instructions      Your urine sample is negative for infection. Your symptoms are consistent with sacroiliitis. Please start taking the prednisone daily in the morning with breakfast. Monitor your glucose while taking as temporary elevations can occur. If you develop change in bladder or bowel habits, tingling down the legs or pain in your groin, please head to the ER.     ED Prescriptions     Medication Sig Dispense Auth. Provider   predniSONE (DELTASONE) 20 MG tablet Take 2 tablets (40 mg total) by mouth daily with breakfast for 5 days. 10 tablet Keva Darty L, [place]      PDMP not reviewed this encounter.   Maretta Bees, [place] 05/29/23 2039

## 2023-05-29 NOTE — Discharge Instructions (Addendum)
Your urine sample is negative for infection. Your symptoms are consistent with sacroiliitis. Please start taking the prednisone daily in the morning with breakfast. Monitor your glucose while taking as temporary elevations can occur. If you develop change in bladder or bowel habits, tingling down the legs or pain in your groin, please head to the ER.

## 2023-05-30 DIAGNOSIS — N63 Unspecified lump in unspecified breast: Secondary | ICD-10-CM | POA: Diagnosis not present

## 2023-05-30 DIAGNOSIS — N6315 Unspecified lump in the right breast, overlapping quadrants: Secondary | ICD-10-CM | POA: Diagnosis not present

## 2023-05-30 DIAGNOSIS — N644 Mastodynia: Secondary | ICD-10-CM | POA: Diagnosis not present

## 2023-06-12 DIAGNOSIS — Z0001 Encounter for general adult medical examination with abnormal findings: Secondary | ICD-10-CM | POA: Diagnosis not present

## 2023-06-12 DIAGNOSIS — Z79899 Other long term (current) drug therapy: Secondary | ICD-10-CM | POA: Diagnosis not present

## 2023-06-12 DIAGNOSIS — Z8249 Family history of ischemic heart disease and other diseases of the circulatory system: Secondary | ICD-10-CM | POA: Diagnosis not present

## 2023-06-12 DIAGNOSIS — Z6832 Body mass index (BMI) 32.0-32.9, adult: Secondary | ICD-10-CM | POA: Diagnosis not present

## 2023-06-12 DIAGNOSIS — E785 Hyperlipidemia, unspecified: Secondary | ICD-10-CM | POA: Diagnosis not present

## 2023-06-12 DIAGNOSIS — E1165 Type 2 diabetes mellitus with hyperglycemia: Secondary | ICD-10-CM | POA: Diagnosis not present

## 2023-06-12 DIAGNOSIS — M109 Gout, unspecified: Secondary | ICD-10-CM | POA: Diagnosis not present

## 2023-06-12 DIAGNOSIS — I119 Hypertensive heart disease without heart failure: Secondary | ICD-10-CM | POA: Diagnosis not present

## 2023-06-12 DIAGNOSIS — E669 Obesity, unspecified: Secondary | ICD-10-CM | POA: Diagnosis not present

## 2023-06-12 DIAGNOSIS — L299 Pruritus, unspecified: Secondary | ICD-10-CM | POA: Diagnosis not present

## 2023-07-12 DIAGNOSIS — E669 Obesity, unspecified: Secondary | ICD-10-CM | POA: Diagnosis not present

## 2023-07-12 DIAGNOSIS — Z008 Encounter for other general examination: Secondary | ICD-10-CM | POA: Diagnosis not present

## 2023-07-12 DIAGNOSIS — Z809 Family history of malignant neoplasm, unspecified: Secondary | ICD-10-CM | POA: Diagnosis not present

## 2023-07-12 DIAGNOSIS — Z7984 Long term (current) use of oral hypoglycemic drugs: Secondary | ICD-10-CM | POA: Diagnosis not present

## 2023-07-12 DIAGNOSIS — Z6832 Body mass index (BMI) 32.0-32.9, adult: Secondary | ICD-10-CM | POA: Diagnosis not present

## 2023-07-12 DIAGNOSIS — Z973 Presence of spectacles and contact lenses: Secondary | ICD-10-CM | POA: Diagnosis not present

## 2023-07-12 DIAGNOSIS — M1030 Gout due to renal impairment, unspecified site: Secondary | ICD-10-CM | POA: Diagnosis not present

## 2023-07-12 DIAGNOSIS — I129 Hypertensive chronic kidney disease with stage 1 through stage 4 chronic kidney disease, or unspecified chronic kidney disease: Secondary | ICD-10-CM | POA: Diagnosis not present

## 2023-07-12 DIAGNOSIS — E1122 Type 2 diabetes mellitus with diabetic chronic kidney disease: Secondary | ICD-10-CM | POA: Diagnosis not present

## 2023-07-12 DIAGNOSIS — N189 Chronic kidney disease, unspecified: Secondary | ICD-10-CM | POA: Diagnosis not present

## 2023-07-12 DIAGNOSIS — E785 Hyperlipidemia, unspecified: Secondary | ICD-10-CM | POA: Diagnosis not present

## 2023-09-03 DIAGNOSIS — E11311 Type 2 diabetes mellitus with unspecified diabetic retinopathy with macular edema: Secondary | ICD-10-CM | POA: Diagnosis not present

## 2023-09-03 DIAGNOSIS — H2513 Age-related nuclear cataract, bilateral: Secondary | ICD-10-CM | POA: Diagnosis not present

## 2023-09-03 DIAGNOSIS — H40003 Preglaucoma, unspecified, bilateral: Secondary | ICD-10-CM | POA: Diagnosis not present

## 2023-09-03 DIAGNOSIS — H5203 Hypermetropia, bilateral: Secondary | ICD-10-CM | POA: Diagnosis not present

## 2023-10-09 DIAGNOSIS — Z6833 Body mass index (BMI) 33.0-33.9, adult: Secondary | ICD-10-CM | POA: Diagnosis not present

## 2023-10-09 DIAGNOSIS — Z79899 Other long term (current) drug therapy: Secondary | ICD-10-CM | POA: Diagnosis not present

## 2023-10-09 DIAGNOSIS — E1165 Type 2 diabetes mellitus with hyperglycemia: Secondary | ICD-10-CM | POA: Diagnosis not present

## 2023-10-09 DIAGNOSIS — M109 Gout, unspecified: Secondary | ICD-10-CM | POA: Diagnosis not present

## 2023-10-09 DIAGNOSIS — I119 Hypertensive heart disease without heart failure: Secondary | ICD-10-CM | POA: Diagnosis not present

## 2023-10-09 DIAGNOSIS — L299 Pruritus, unspecified: Secondary | ICD-10-CM | POA: Diagnosis not present

## 2023-10-09 DIAGNOSIS — N1831 Chronic kidney disease, stage 3a: Secondary | ICD-10-CM | POA: Diagnosis not present

## 2023-10-09 DIAGNOSIS — Z8249 Family history of ischemic heart disease and other diseases of the circulatory system: Secondary | ICD-10-CM | POA: Diagnosis not present

## 2023-10-09 DIAGNOSIS — E669 Obesity, unspecified: Secondary | ICD-10-CM | POA: Diagnosis not present

## 2023-10-09 DIAGNOSIS — E785 Hyperlipidemia, unspecified: Secondary | ICD-10-CM | POA: Diagnosis not present

## 2024-02-12 DIAGNOSIS — E669 Obesity, unspecified: Secondary | ICD-10-CM | POA: Diagnosis not present

## 2024-02-12 DIAGNOSIS — Z79899 Other long term (current) drug therapy: Secondary | ICD-10-CM | POA: Diagnosis not present

## 2024-02-12 DIAGNOSIS — E785 Hyperlipidemia, unspecified: Secondary | ICD-10-CM | POA: Diagnosis not present

## 2024-02-12 DIAGNOSIS — Z6832 Body mass index (BMI) 32.0-32.9, adult: Secondary | ICD-10-CM | POA: Diagnosis not present

## 2024-02-12 DIAGNOSIS — I119 Hypertensive heart disease without heart failure: Secondary | ICD-10-CM | POA: Diagnosis not present

## 2024-02-12 DIAGNOSIS — Z8249 Family history of ischemic heart disease and other diseases of the circulatory system: Secondary | ICD-10-CM | POA: Diagnosis not present

## 2024-02-12 DIAGNOSIS — M109 Gout, unspecified: Secondary | ICD-10-CM | POA: Diagnosis not present

## 2024-02-12 DIAGNOSIS — L299 Pruritus, unspecified: Secondary | ICD-10-CM | POA: Diagnosis not present

## 2024-02-12 DIAGNOSIS — N1831 Chronic kidney disease, stage 3a: Secondary | ICD-10-CM | POA: Diagnosis not present

## 2024-02-12 DIAGNOSIS — E1165 Type 2 diabetes mellitus with hyperglycemia: Secondary | ICD-10-CM | POA: Diagnosis not present

## 2024-02-12 DIAGNOSIS — Z780 Asymptomatic menopausal state: Secondary | ICD-10-CM | POA: Diagnosis not present

## 2024-06-01 DIAGNOSIS — Z1231 Encounter for screening mammogram for malignant neoplasm of breast: Secondary | ICD-10-CM | POA: Diagnosis not present

## 2024-07-08 DIAGNOSIS — H43823 Vitreomacular adhesion, bilateral: Secondary | ICD-10-CM | POA: Diagnosis not present

## 2024-07-08 DIAGNOSIS — H43813 Vitreous degeneration, bilateral: Secondary | ICD-10-CM | POA: Diagnosis not present

## 2024-07-08 DIAGNOSIS — E113293 Type 2 diabetes mellitus with mild nonproliferative diabetic retinopathy without macular edema, bilateral: Secondary | ICD-10-CM | POA: Diagnosis not present

## 2024-07-08 DIAGNOSIS — H35033 Hypertensive retinopathy, bilateral: Secondary | ICD-10-CM | POA: Diagnosis not present

## 2024-11-11 DIAGNOSIS — E1165 Type 2 diabetes mellitus with hyperglycemia: Secondary | ICD-10-CM | POA: Diagnosis not present

## 2024-11-11 DIAGNOSIS — E669 Obesity, unspecified: Secondary | ICD-10-CM | POA: Diagnosis not present

## 2024-11-11 DIAGNOSIS — Z780 Asymptomatic menopausal state: Secondary | ICD-10-CM | POA: Diagnosis not present

## 2024-11-11 DIAGNOSIS — Z8249 Family history of ischemic heart disease and other diseases of the circulatory system: Secondary | ICD-10-CM | POA: Diagnosis not present

## 2024-11-11 DIAGNOSIS — M109 Gout, unspecified: Secondary | ICD-10-CM | POA: Diagnosis not present

## 2024-11-11 DIAGNOSIS — N1831 Chronic kidney disease, stage 3a: Secondary | ICD-10-CM | POA: Diagnosis not present

## 2024-11-11 DIAGNOSIS — Z79899 Other long term (current) drug therapy: Secondary | ICD-10-CM | POA: Diagnosis not present

## 2024-11-11 DIAGNOSIS — Z6832 Body mass index (BMI) 32.0-32.9, adult: Secondary | ICD-10-CM | POA: Diagnosis not present

## 2024-11-11 DIAGNOSIS — E785 Hyperlipidemia, unspecified: Secondary | ICD-10-CM | POA: Diagnosis not present

## 2024-11-11 DIAGNOSIS — L299 Pruritus, unspecified: Secondary | ICD-10-CM | POA: Diagnosis not present

## 2024-11-11 DIAGNOSIS — I119 Hypertensive heart disease without heart failure: Secondary | ICD-10-CM | POA: Diagnosis not present

## 2024-11-15 DIAGNOSIS — Z1211 Encounter for screening for malignant neoplasm of colon: Secondary | ICD-10-CM | POA: Diagnosis not present

## 2024-11-15 DIAGNOSIS — Z1212 Encounter for screening for malignant neoplasm of rectum: Secondary | ICD-10-CM | POA: Diagnosis not present
